# Patient Record
Sex: Female | Born: 1960
Health system: Southern US, Community
[De-identification: ages and names within clinical notes are randomized; demographics above are authoritative.]

## PROBLEM LIST (undated history)

## (undated) DIAGNOSIS — K219 Gastro-esophageal reflux disease without esophagitis: Secondary | ICD-10-CM

## (undated) DIAGNOSIS — Z807 Family history of other malignant neoplasms of lymphoid, hematopoietic and related tissues: Secondary | ICD-10-CM

## (undated) DIAGNOSIS — G500 Trigeminal neuralgia: Secondary | ICD-10-CM

## (undated) DIAGNOSIS — F329 Major depressive disorder, single episode, unspecified: Secondary | ICD-10-CM

## (undated) DIAGNOSIS — E7800 Pure hypercholesterolemia, unspecified: Secondary | ICD-10-CM

## (undated) DIAGNOSIS — J069 Acute upper respiratory infection, unspecified: Secondary | ICD-10-CM

## (undated) DIAGNOSIS — F32A Depression, unspecified: Secondary | ICD-10-CM

## (undated) DIAGNOSIS — Z803 Family history of malignant neoplasm of breast: Secondary | ICD-10-CM

## (undated) DIAGNOSIS — Z808 Family history of malignant neoplasm of other organs or systems: Secondary | ICD-10-CM

## (undated) DIAGNOSIS — R079 Chest pain, unspecified: Secondary | ICD-10-CM

## (undated) DIAGNOSIS — F319 Bipolar disorder, unspecified: Secondary | ICD-10-CM

## (undated) DIAGNOSIS — Z800 Family history of malignant neoplasm of digestive organs: Secondary | ICD-10-CM

## (undated) DIAGNOSIS — J309 Allergic rhinitis, unspecified: Secondary | ICD-10-CM

## (undated) DIAGNOSIS — D473 Essential (hemorrhagic) thrombocythemia: Secondary | ICD-10-CM

## (undated) DIAGNOSIS — F419 Anxiety disorder, unspecified: Secondary | ICD-10-CM

## (undated) DIAGNOSIS — F909 Attention-deficit hyperactivity disorder, unspecified type: Secondary | ICD-10-CM

## (undated) HISTORY — DX: Acute upper respiratory infection, unspecified: J06.9

## (undated) HISTORY — DX: Depression, unspecified: F32.A

## (undated) HISTORY — DX: Family history of malignant neoplasm of other organs or systems: Z80.8

## (undated) HISTORY — PX: BREAST BIOPSY: SHX20

## (undated) HISTORY — DX: Pure hypercholesterolemia, unspecified: E78.00

## (undated) HISTORY — DX: Family history of malignant neoplasm of digestive organs: Z80.0

## (undated) HISTORY — PX: CHOLECYSTECTOMY: SHX55

## (undated) HISTORY — DX: Chest pain, unspecified: R07.9

## (undated) HISTORY — DX: Trigeminal neuralgia: G50.0

## (undated) HISTORY — DX: Allergic rhinitis, unspecified: J30.9

## (undated) HISTORY — DX: Essential (hemorrhagic) thrombocythemia: D47.3

## (undated) HISTORY — DX: Major depressive disorder, single episode, unspecified: F32.9

## (undated) HISTORY — DX: Attention-deficit hyperactivity disorder, unspecified type: F90.9

## (undated) HISTORY — DX: Family history of other malignant neoplasms of lymphoid, hematopoietic and related tissues: Z80.7

## (undated) HISTORY — DX: Family history of malignant neoplasm of breast: Z80.3

## (undated) HISTORY — DX: Bipolar disorder, unspecified: F31.9

## (undated) HISTORY — DX: Gastro-esophageal reflux disease without esophagitis: K21.9

## (undated) HISTORY — PX: CYST REMOVAL HAND: SHX6279

## (undated) HISTORY — DX: Anxiety disorder, unspecified: F41.9

---

## 1998-08-10 ENCOUNTER — Emergency Department (HOSPITAL_COMMUNITY): Admission: EM | Admit: 1998-08-10 | Discharge: 1998-08-10 | Payer: Self-pay | Admitting: Internal Medicine

## 1999-12-04 ENCOUNTER — Encounter: Payer: Self-pay | Admitting: Internal Medicine

## 1999-12-04 ENCOUNTER — Ambulatory Visit (HOSPITAL_COMMUNITY): Admission: RE | Admit: 1999-12-04 | Discharge: 1999-12-04 | Payer: Self-pay | Admitting: Internal Medicine

## 2000-09-11 ENCOUNTER — Encounter: Payer: Self-pay | Admitting: Internal Medicine

## 2000-09-11 ENCOUNTER — Encounter: Admission: RE | Admit: 2000-09-11 | Discharge: 2000-09-11 | Payer: Self-pay | Admitting: Internal Medicine

## 2000-10-07 ENCOUNTER — Ambulatory Visit (HOSPITAL_BASED_OUTPATIENT_CLINIC_OR_DEPARTMENT_OTHER): Admission: RE | Admit: 2000-10-07 | Discharge: 2000-10-07 | Payer: Self-pay | Admitting: General Surgery

## 2000-10-07 ENCOUNTER — Encounter (INDEPENDENT_AMBULATORY_CARE_PROVIDER_SITE_OTHER): Payer: Self-pay | Admitting: Specialist

## 2002-08-12 ENCOUNTER — Ambulatory Visit (HOSPITAL_COMMUNITY): Admission: RE | Admit: 2002-08-12 | Discharge: 2002-08-12 | Payer: Self-pay | Admitting: Internal Medicine

## 2002-08-12 ENCOUNTER — Encounter: Payer: Self-pay | Admitting: Internal Medicine

## 2003-10-23 ENCOUNTER — Other Ambulatory Visit: Admission: RE | Admit: 2003-10-23 | Discharge: 2003-10-23 | Payer: Self-pay | Admitting: Internal Medicine

## 2003-11-23 ENCOUNTER — Ambulatory Visit (HOSPITAL_COMMUNITY): Admission: RE | Admit: 2003-11-23 | Discharge: 2003-11-23 | Payer: Self-pay | Admitting: Neurology

## 2004-11-01 ENCOUNTER — Ambulatory Visit (HOSPITAL_COMMUNITY): Admission: RE | Admit: 2004-11-01 | Discharge: 2004-11-01 | Payer: Self-pay | Admitting: Internal Medicine

## 2005-10-27 ENCOUNTER — Other Ambulatory Visit: Admission: RE | Admit: 2005-10-27 | Discharge: 2005-10-27 | Payer: Self-pay | Admitting: Internal Medicine

## 2007-08-25 ENCOUNTER — Other Ambulatory Visit: Admission: RE | Admit: 2007-08-25 | Discharge: 2007-08-25 | Payer: Self-pay | Admitting: Internal Medicine

## 2007-09-09 ENCOUNTER — Ambulatory Visit (HOSPITAL_COMMUNITY): Admission: RE | Admit: 2007-09-09 | Discharge: 2007-09-09 | Payer: Self-pay | Admitting: Internal Medicine

## 2008-08-25 ENCOUNTER — Ambulatory Visit: Payer: Self-pay | Admitting: Sports Medicine

## 2008-08-25 DIAGNOSIS — M25569 Pain in unspecified knee: Secondary | ICD-10-CM | POA: Insufficient documentation

## 2008-08-25 DIAGNOSIS — M7650 Patellar tendinitis, unspecified knee: Secondary | ICD-10-CM | POA: Insufficient documentation

## 2008-08-29 ENCOUNTER — Encounter: Payer: Self-pay | Admitting: Sports Medicine

## 2009-09-13 ENCOUNTER — Other Ambulatory Visit: Admission: RE | Admit: 2009-09-13 | Discharge: 2009-09-13 | Payer: Self-pay | Admitting: General Surgery

## 2009-09-17 ENCOUNTER — Ambulatory Visit (HOSPITAL_COMMUNITY): Admission: RE | Admit: 2009-09-17 | Discharge: 2009-09-17 | Payer: Self-pay | Admitting: Internal Medicine

## 2010-10-17 ENCOUNTER — Ambulatory Visit (HOSPITAL_COMMUNITY)
Admission: RE | Admit: 2010-10-17 | Discharge: 2010-10-17 | Payer: Self-pay | Source: Home / Self Care | Attending: Internal Medicine | Admitting: Internal Medicine

## 2011-10-20 ENCOUNTER — Other Ambulatory Visit (HOSPITAL_COMMUNITY): Payer: Self-pay | Admitting: Internal Medicine

## 2011-10-20 DIAGNOSIS — Z1231 Encounter for screening mammogram for malignant neoplasm of breast: Secondary | ICD-10-CM

## 2011-11-10 ENCOUNTER — Ambulatory Visit (INDEPENDENT_AMBULATORY_CARE_PROVIDER_SITE_OTHER): Payer: 59 | Admitting: Family Medicine

## 2011-11-10 VITALS — BP 124/84 | HR 80 | Temp 98.1°F | Resp 16 | Ht 63.25 in | Wt 174.0 lb

## 2011-11-10 DIAGNOSIS — J019 Acute sinusitis, unspecified: Secondary | ICD-10-CM

## 2011-11-10 MED ORDER — LEVOFLOXACIN 500 MG PO TABS: 500.0000 mg | ORAL_TABLET | Freq: Every day | ORAL | Status: AC

## 2011-11-10 NOTE — Progress Notes (Signed)
Is a 51 year old woman who is unemployed. She recently went on a trip to [place] with her friends. She comes in today complaining of severe sinus congestion which began about 3 days ago. She developed right maxillary tenderness. Been unable to breathe through her mouth she flew home yesterday and had excruciating pain as of late this is a plain descended.  Patient has recently had dental work prior to the sinus infection and was placed on clindamycin during that time. She had some loose stools but this has resolved.   objective: Small hemorrhagic hemotympanums on the left.  Nose significantly swollen with mucopurulent drainage. Right TM serous changes. Oropharynx clear. Chest clear. Neck supple without adenopathy.  Assessment: Acute sinusitis with hemotympanum on the left.  Plan: Levaquin and Afrin. S. patient is to take culturelle for another probiotic while taking Levaquin.

## 2011-11-10 NOTE — Patient Instructions (Signed)
Use Afrin type spray twice daily for three days. Take Culturelle or other probiotic daily while on antibiotics    Sinusitis Sinuses are air pockets within the bones of your face. The growth of bacteria within a sinus leads to infection. The infection prevents the sinuses from draining. This infection is called sinusitis. SYMPTOMS  There will be different areas of pain depending on which sinuses have become infected.  The maxillary sinuses often produce pain beneath the eyes.   Frontal sinusitis may cause pain in the middle of the forehead and above the eyes.  Other problems (symptoms) include:  Toothaches.   Colored, pus-like (purulent) drainage from the nose.   Swelling, warmth, and tenderness over the sinus areas may be signs of infection.  TREATMENT  Sinusitis is most often determined by an exam.X-rays may be taken. If x-rays have been taken, make sure you obtain your results or find out how you are to obtain them. Your caregiver may give you medications (antibiotics). These are medications that will help kill the bacteria causing the infection. You may also be given a medication (decongestant) that helps to reduce sinus swelling.  HOME CARE INSTRUCTIONS   Only take over-the-counter or prescription medicines for pain, discomfort, or fever as directed by your caregiver.   Drink extra fluids. Fluids help thin the mucus so your sinuses can drain more easily.   Applying either moist heat or ice packs to the sinus areas may help relieve discomfort.   Use saline nasal sprays to help moisten your sinuses. The sprays can be found at your local drugstore.  SEEK IMMEDIATE MEDICAL CARE IF:  You have a fever.   You have increasing pain, severe headaches, or toothache.   You have nausea, vomiting, or drowsiness.   You develop unusual swelling around the face or trouble seeing.  MAKE SURE YOU:   Understand these instructions.   Will watch your condition.   Will get help right away  if you are not doing well or get worse.  Document Released: 09/22/2005 Document Revised: 06/04/2011 Document Reviewed: 04/21/2007 [facility] Patient Information 2012 [place], [place].

## 2011-11-17 ENCOUNTER — Ambulatory Visit (HOSPITAL_COMMUNITY)
Admission: RE | Admit: 2011-11-17 | Discharge: 2011-11-17 | Disposition: A | Discharge status: Home / Self Care | Payer: 59 | Source: Ambulatory Visit | Attending: Internal Medicine | Admitting: Internal Medicine

## 2011-11-17 DIAGNOSIS — Z1231 Encounter for screening mammogram for malignant neoplasm of breast: Secondary | ICD-10-CM | POA: Insufficient documentation

## 2011-12-26 ENCOUNTER — Ambulatory Visit
Admission: RE | Admit: 2011-12-26 | Discharge: 2011-12-26 | Disposition: A | Discharge status: Home / Self Care | Payer: 59 | Source: Ambulatory Visit | Attending: Family Medicine | Admitting: Family Medicine

## 2011-12-26 ENCOUNTER — Other Ambulatory Visit: Payer: Self-pay | Admitting: Family Medicine

## 2011-12-26 DIAGNOSIS — J329 Chronic sinusitis, unspecified: Secondary | ICD-10-CM

## 2012-01-26 ENCOUNTER — Ambulatory Visit (INDEPENDENT_AMBULATORY_CARE_PROVIDER_SITE_OTHER): Payer: 59 | Admitting: Family Medicine

## 2012-01-26 ENCOUNTER — Encounter: Payer: Self-pay | Admitting: Family Medicine

## 2012-01-26 VITALS — BP 108/74 | HR 88 | Temp 99.5°F | Resp 16 | Ht 64.0 in | Wt 185.2 lb

## 2012-01-26 DIAGNOSIS — J0100 Acute maxillary sinusitis, unspecified: Secondary | ICD-10-CM

## 2012-01-26 MED ORDER — MOMETASONE FUROATE 50 MCG/ACT NA SUSP: 2.0000 | Freq: Every day | NASAL | Status: DC

## 2012-01-26 MED ORDER — LEVOFLOXACIN 500 MG PO TABS: 500.0000 mg | ORAL_TABLET | Freq: Every day | ORAL | Status: AC

## 2012-01-26 NOTE — Progress Notes (Signed)
51 yo with severe sinus infection treated with cefdinir and prednisone until last week.  PCP did blood work showing elevated platelets, abnormal LFT's.  Now the left malar area is feeling full again with left ear pressure.  O:  NAD Skin:  Plethoric TM's appears normal Nose:  Very erythematous and swollen Oroph: mild erythema without exudates.  A:  Sinusitis, persistent  P:  Levaquin 500 mg qd x 7 Nasonex bid x 3 days, then qd

## 2012-01-26 NOTE — Patient Instructions (Addendum)
Sinusitis Sinuses are air pockets within the bones of your face. The growth of bacteria within a sinus leads to infection. The infection prevents the sinuses from draining. This infection is called sinusitis. SYMPTOMS  There will be different areas of pain depending on which sinuses have become infected.  The maxillary sinuses often produce pain beneath the eyes.   Frontal sinusitis may cause pain in the middle of the forehead and above the eyes.  Other problems (symptoms) include:  Toothaches.   Colored, pus-like (purulent) drainage from the nose.   Swelling, warmth, and tenderness over the sinus areas may be signs of infection.  TREATMENT  Sinusitis is most often determined by an exam.X-rays may be taken. If x-rays have been taken, make sure you obtain your results or find out how you are to obtain them. Your caregiver may give you medications (antibiotics). These are medications that will help kill the bacteria causing the infection. You may also be given a medication (decongestant) that helps to reduce sinus swelling.  HOME CARE INSTRUCTIONS   Only take over-the-counter or prescription medicines for pain, discomfort, or fever as directed by your caregiver.   Drink extra fluids. Fluids help thin the mucus so your sinuses can drain more easily.   Applying either moist heat or ice packs to the sinus areas may help relieve discomfort.   Use saline nasal sprays to help moisten your sinuses. The sprays can be found at your local drugstore.  SEEK IMMEDIATE MEDICAL CARE IF:  You have a fever.   You have increasing pain, severe headaches, or toothache.   You have nausea, vomiting, or drowsiness.   You develop unusual swelling around the face or trouble seeing.  MAKE SURE YOU:   Understand these instructions.   Will watch your condition.   Will get help right away if you are not doing well or get worse.  Document Released: 09/22/2005 Document Revised: 09/11/2011 Document Reviewed:  04/21/2007 [employer] Patient Information 2012 [employer].Sinusitis Sinuses are air pockets within the bones of your face. The growth of bacteria within a sinus leads to infection. The infection prevents the sinuses from draining. This infection is called sinusitis. SYMPTOMS  There will be different areas of pain depending on which sinuses have become infected.  The maxillary sinuses often produce pain beneath the eyes.   Frontal sinusitis may cause pain in the middle of the forehead and above the eyes.  Other problems (symptoms) include:  Toothaches.   Colored, pus-like (purulent) drainage from the nose.   Swelling, warmth, and tenderness over the sinus areas may be signs of infection.  TREATMENT  Sinusitis is most often determined by an exam.X-rays may be taken. If x-rays have been taken, make sure you obtain your results or find out how you are to obtain them. Your caregiver may give you medications (antibiotics). These are medications that will help kill the bacteria causing the infection. You may also be given a medication (decongestant) that helps to reduce sinus swelling.  HOME CARE INSTRUCTIONS   Only take over-the-counter or prescription medicines for pain, discomfort, or fever as directed by your caregiver.   Drink extra fluids. Fluids help thin the mucus so your sinuses can drain more easily.   Applying either moist heat or ice packs to the sinus areas may help relieve discomfort.   Use saline nasal sprays to help moisten your sinuses. The sprays can be found at your local drugstore.  SEEK IMMEDIATE MEDICAL CARE IF:  You have a fever.     You have increasing pain, severe headaches, or toothache.   You have nausea, vomiting, or drowsiness.   You develop unusual swelling around the face or trouble seeing.  MAKE SURE YOU:   Understand these instructions.   Will watch your condition.   Will get help right away if you are not doing well or get worse.  Document  Released: 09/22/2005 Document Revised: 09/11/2011 Document Reviewed: 04/21/2007 [employer] Patient Information 2012 [employer]. 

## 2012-03-10 ENCOUNTER — Ambulatory Visit (INDEPENDENT_AMBULATORY_CARE_PROVIDER_SITE_OTHER): Payer: 59 | Admitting: Physician Assistant

## 2012-03-10 VITALS — BP 133/84 | HR 66 | Temp 98.4°F | Resp 18 | Ht 64.0 in | Wt 186.4 lb

## 2012-03-10 DIAGNOSIS — J329 Chronic sinusitis, unspecified: Secondary | ICD-10-CM

## 2012-03-10 DIAGNOSIS — R05 Cough: Secondary | ICD-10-CM

## 2012-03-10 DIAGNOSIS — R059 Cough, unspecified: Secondary | ICD-10-CM

## 2012-03-10 DIAGNOSIS — H109 Unspecified conjunctivitis: Secondary | ICD-10-CM

## 2012-03-10 MED ORDER — POLYMYXIN B-TRIMETHOPRIM 10000-0.1 UNIT/ML-% OP SOLN: 1.0000 [drp] | OPHTHALMIC | Status: AC

## 2012-03-10 MED ORDER — AZITHROMYCIN 250 MG PO TABS: ORAL_TABLET | ORAL | Status: AC

## 2012-03-10 MED ORDER — BENZONATATE 100 MG PO CAPS: 100.0000 mg | ORAL_CAPSULE | Freq: Three times a day (TID) | ORAL | Status: AC | PRN

## 2012-03-10 NOTE — Progress Notes (Signed)
  Subjective:    Patient ID: Anne Watson, female    DOB: [birth year], 51 y.o.   MRN: [id number]  HPI Patient presents with sore throat, slight cough, right eye drainage, and ear pressure. Denies fever, chills, sinus pain, nausea, vomiting or headache. She admits to taking only a couple of OTC Allegra and 1 Mucinex which did not help her symptoms. She is due to start allergy treatment this coming Friday.     Review of Systems  Constitutional: Negative for fever and chills.  HENT: Positive for ear pain (pressure), congestion, sore throat, rhinorrhea and postnasal drip. Negative for neck pain and sinus pressure.   Respiratory: Positive for cough (mild). Negative for wheezing.   Cardiovascular: Negative for chest pain.  Gastrointestinal: Negative for nausea and vomiting.  Neurological: Negative for light-headedness and headaches.       Objective:   Physical Exam  Constitutional: She is oriented to person, place, and time. She appears well-developed and well-nourished.  HENT:  Head: Normocephalic and atraumatic.  Right Ear: Hearing, tympanic membrane, external ear and ear canal normal.  Left Ear: Hearing, external ear and ear canal normal. A middle ear effusion is present.  Mouth/Throat: Uvula is midline. No oropharyngeal exudate.       Mild bilateral erythema  Eyes: EOM and lids are normal. Pupils are equal, round, and reactive to light. Right conjunctiva is injected (withe mild purulent drainage).  Neck: Neck supple.  Cardiovascular: Normal rate, regular rhythm and normal heart sounds.   Pulmonary/Chest: Effort normal and breath sounds normal.  Lymphadenopathy:    She has no cervical adenopathy.  Neurological: She is alert and oriented to person, place, and time.  Psychiatric: She has a normal mood and affect. Her behavior is normal. Judgment and thought content normal.          Assessment & Plan:   1. Cough  Will treat with Tessalon perles at night. Take Allegra in  addition to help with postnasal drip. benzonatate (TESSALON) 100 MG capsule  2. Conjunctivitis  Recommend warm wash cloth to eye twice daily to help with drainage.  trimethoprim-polymyxin b (POLYTRIM) ophthalmic solution  3. Sinusitis  Follow up if no improvement in symptoms after completion of therapy.  azithromycin (ZITHROMAX) 250 MG tablet

## 2012-03-10 NOTE — Patient Instructions (Signed)
Continue Allegra daily to help with allergic symptoms

## 2013-02-01 ENCOUNTER — Other Ambulatory Visit: Payer: Self-pay | Admitting: Podiatry

## 2013-03-29 ENCOUNTER — Other Ambulatory Visit (HOSPITAL_COMMUNITY): Payer: Self-pay | Admitting: Internal Medicine

## 2013-03-29 DIAGNOSIS — Z1231 Encounter for screening mammogram for malignant neoplasm of breast: Secondary | ICD-10-CM

## 2013-04-21 ENCOUNTER — Ambulatory Visit (HOSPITAL_COMMUNITY)
Admission: RE | Admit: 2013-04-21 | Discharge: 2013-04-21 | Disposition: A | Discharge status: Home / Self Care | Payer: No Typology Code available for payment source | Source: Ambulatory Visit | Attending: Internal Medicine | Admitting: Internal Medicine

## 2013-04-21 DIAGNOSIS — Z1231 Encounter for screening mammogram for malignant neoplasm of breast: Secondary | ICD-10-CM | POA: Insufficient documentation

## 2014-03-12 ENCOUNTER — Emergency Department (HOSPITAL_COMMUNITY)
Admission: EM | Admit: 2014-03-12 | Discharge: 2014-03-13 | Disposition: A | Discharge status: Home / Self Care | Payer: BC Managed Care – PPO | Attending: Emergency Medicine | Admitting: Emergency Medicine

## 2014-03-12 ENCOUNTER — Encounter (HOSPITAL_COMMUNITY): Payer: Self-pay | Admitting: Emergency Medicine

## 2014-03-12 DIAGNOSIS — Y929 Unspecified place or not applicable: Secondary | ICD-10-CM | POA: Insufficient documentation

## 2014-03-12 DIAGNOSIS — Z87891 Personal history of nicotine dependence: Secondary | ICD-10-CM | POA: Insufficient documentation

## 2014-03-12 DIAGNOSIS — S8010XA Contusion of unspecified lower leg, initial encounter: Secondary | ICD-10-CM | POA: Insufficient documentation

## 2014-03-12 DIAGNOSIS — F329 Major depressive disorder, single episode, unspecified: Secondary | ICD-10-CM | POA: Insufficient documentation

## 2014-03-12 DIAGNOSIS — G500 Trigeminal neuralgia: Secondary | ICD-10-CM | POA: Insufficient documentation

## 2014-03-12 DIAGNOSIS — Z888 Allergy status to other drugs, medicaments and biological substances status: Secondary | ICD-10-CM | POA: Insufficient documentation

## 2014-03-12 DIAGNOSIS — F3289 Other specified depressive episodes: Secondary | ICD-10-CM | POA: Insufficient documentation

## 2014-03-12 DIAGNOSIS — K219 Gastro-esophageal reflux disease without esophagitis: Secondary | ICD-10-CM | POA: Insufficient documentation

## 2014-03-12 DIAGNOSIS — S8011XA Contusion of right lower leg, initial encounter: Secondary | ICD-10-CM

## 2014-03-12 DIAGNOSIS — Z853 Personal history of malignant neoplasm of breast: Secondary | ICD-10-CM | POA: Insufficient documentation

## 2014-03-12 DIAGNOSIS — D473 Essential (hemorrhagic) thrombocythemia: Secondary | ICD-10-CM | POA: Insufficient documentation

## 2014-03-12 DIAGNOSIS — Z880 Allergy status to penicillin: Secondary | ICD-10-CM | POA: Insufficient documentation

## 2014-03-12 DIAGNOSIS — Y939 Activity, unspecified: Secondary | ICD-10-CM | POA: Insufficient documentation

## 2014-03-12 DIAGNOSIS — W19XXXA Unspecified fall, initial encounter: Secondary | ICD-10-CM | POA: Insufficient documentation

## 2014-03-12 DIAGNOSIS — E7800 Pure hypercholesterolemia, unspecified: Secondary | ICD-10-CM | POA: Insufficient documentation

## 2014-03-12 NOTE — ED Notes (Signed)
Per pt report: pt has swelling on the right leg. Pt noticed it last night when pt was in the shower.  Pt has a hematoma on near the knee cap.  Pt hx of high platelet count. Pt not in any pain or having SOB. Legs equally warm and pt has a cap refill <3 seconds. Pt a/o x 4. Skin warm and dry. Pt ambulatory.

## 2014-03-13 NOTE — ED Provider Notes (Signed)
CSN: [id number]     Arrival date & time 03/12/14  2233 History   First MD Initiated Contact with Patient 03/13/14 0017     Chief Complaint  Patient presents with  . Leg Swelling     (Consider location/radiation/quality/duration/timing/severity/associated sxs/prior Treatment) HPI Patient is a 53 yo woman who notes that she was diagnosed in Feb 2015 with thrombocytosis (platelet count 620,000) incidentally.   She comes in because she has noticed a small hematoma over the lateral aspect of the proximal right lower leg. First noticed swelling yesterday, then discoloration of skin today. She has mild ttp. Can't recall hx of trauma to this region.  A family member told her that she should get checked out for a blood clot.   No other incidental bruises or hematomas, no bleeding from gums.   Past Medical History  Diagnosis Date  . GERD (gastroesophageal reflux disease)   . Hypercholesteremia   . Depression   . Trigeminal neuralgia   . Chest pain   . Breast cancer   . Allergic rhinitis   . Thrombocytosis    Past Surgical History  Procedure Laterality Date  . Cholecystectomy    . Breast biopsy Left    Family History  Problem Relation Age of Onset  . Breast cancer Mother   . Stroke Father   . Hypertension Father   . Breast cancer Sister    History  Substance Use Topics  . Smoking status: Former Smoker    Quit date: 10/06/2004  . Smokeless tobacco: Not on file  . Alcohol Use: Yes     Comment: occasional   OB History   Grav Para Term Preterm Abortions TAB SAB Ect Mult Living                 Review of Systems Ten point review of symptoms performed and is negative with the exception of symptoms noted above.     Allergies  Ceftin; Codeine; Penicillins; and Tetracyclines & related  Home Medications   Prior to Admission medications   Medication Sig Start Date End Date Taking? Authorizing Provider  buPROPion (ZYBAN) 150 MG 12 hr tablet Take 150 mg by mouth 2 (two) times  daily.    Historical Provider, MD  cholecalciferol (VITAMIN D) 1000 UNITS tablet Take 1,000 Units by mouth daily.    Historical Provider, MD  mometasone (NASONEX) 50 MCG/ACT nasal spray Place 2 sprays into the nose daily. 01/26/12 01/25/13  Robyn Haber, MD   BP 127/74  Pulse 78  Temp(Src) 98.2 F (36.8 C) (Oral)  Resp 20  SpO2 100% Physical Exam Gen: well developed and well nourished appearing Head: NCAT Eyes: PERL, EOMI Nose: no epistaixis or rhinorrhea Mouth/throat: mucosa is moist and pink Neck: supple, no stridor Lungs: CTA B, no wheezing, rhonchi or rales CV: RRR, no murmur, extremities appear well perfused.  Abd: soft, notender, nondistended Back: no ttp, no cva ttp Skin: warm and dry Ext: approx 2cm x 2cm non raised region of echymosis noted over prox lateral right lower leg, mild ttp, otherwise normal to inspection, no dependent edema Neuro: CN ii-xii grossly intact, no focal deficits Psyche; normal affect,  calm and cooperative.   ED Course  Procedures (including critical care time)  MDM   Final diagnoses:  Traumatic hematoma of right lower leg       Elyn Peers, MD 03/13/14 0040

## 2014-03-13 NOTE — Discharge Instructions (Signed)
Hematoma A hematoma is a collection of blood under the skin, in an organ, in a body space, in a joint space, or in other tissue. The blood can clot to form a lump that you can see and feel. The lump is often firm and may sometimes become sore and tender. Most hematomas get better in a few days to weeks. However, some hematomas may be serious and require medical care. Hematomas can range in size from very small to very large. CAUSES  A hematoma can be caused by a blunt or penetrating injury. It can also be caused by spontaneous leakage from a blood vessel under the skin. Spontaneous leakage from a blood vessel is more likely to occur in older people, especially those taking blood thinners. Sometimes, a hematoma can develop after certain medical procedures. SIGNS AND SYMPTOMS   A firm lump on the body.  Possible pain and tenderness in the area.  Bruising.Blue, dark blue, purple-red, or yellowish skin may appear at the site of the hematoma if the hematoma is close to the surface of the skin. For hematomas in deeper tissues or body spaces, the signs and symptoms may be subtle. For example, an intra-abdominal hematoma may cause abdominal pain, weakness, fainting, and shortness of breath. An intracranial hematoma may cause a headache or symptoms such as weakness, trouble speaking, or a change in consciousness. DIAGNOSIS  A hematoma can usually be diagnosed based on your medical history and a physical exam. Imaging tests may be needed if your health care provider suspects a hematoma in deeper tissues or body spaces, such as the abdomen, head, or chest. These tests may include ultrasonography or a CT scan.  TREATMENT  Hematomas usually go away on their own over time. Rarely does the blood need to be drained out of the body. Large hematomas or those that may affect vital organs will sometimes need surgical drainage or monitoring. HOME CARE INSTRUCTIONS   Apply ice to the injured area:   Put ice in a  plastic bag.   Place a towel between your skin and the bag.   Leave the ice on for 20 minutes, 2 3 times a day for the first 1 to 2 days.   After the first 2 days, switch to using warm compresses on the hematoma.   Elevate the injured area to help decrease pain and swelling. Wrapping the area with an elastic bandage may also be helpful. Compression helps to reduce swelling and promotes shrinking of the hematoma. Make sure the bandage is not wrapped too tight.   If your hematoma is on a lower extremity and is painful, crutches may be helpful for a couple days.   Only take over-the-counter or prescription medicines as directed by your health care provider. SEEK IMMEDIATE MEDICAL CARE IF:   You have increasing pain, or your pain is not controlled with medicine.   You have a fever.   You have worsening swelling or discoloration.   Your skin over the hematoma breaks or starts bleeding.   Your hematoma is in your chest or abdomen and you have weakness, shortness of breath, or a change in consciousness.  Your hematoma is on your scalp (caused by a fall or injury) and you have a worsening headache or a change in alertness or consciousness. MAKE SURE YOU:   Understand these instructions.  Will watch your condition.  Will get help right away if you are not doing well or get worse. Document Released: 05/06/2004 Document Revised: 05/25/2013 Document Reviewed:   03/02/2013 [employer] Patient Information 2014 [employer].  

## 2014-03-21 ENCOUNTER — Telehealth: Payer: Self-pay | Admitting: Hematology and Oncology

## 2014-03-21 NOTE — Telephone Encounter (Signed)
LEFT MESSAGE AND GAVE NP APPT FOR 06/30 @ 2 W/DR. [place].  [place] DX- ELEVATED PLTS CT WELCOME PACKET MAILED.

## 2014-03-21 NOTE — Telephone Encounter (Signed)
PATIENT CALLED TO CONFIRM APPT FOR 06/30 @ 2 W/DR. [place].

## 2014-04-04 ENCOUNTER — Ambulatory Visit: Payer: BC Managed Care – PPO

## 2014-04-04 ENCOUNTER — Encounter: Payer: Self-pay | Admitting: Hematology and Oncology

## 2014-04-04 ENCOUNTER — Ambulatory Visit (HOSPITAL_BASED_OUTPATIENT_CLINIC_OR_DEPARTMENT_OTHER): Payer: BC Managed Care – PPO | Admitting: Hematology and Oncology

## 2014-04-04 ENCOUNTER — Ambulatory Visit (HOSPITAL_BASED_OUTPATIENT_CLINIC_OR_DEPARTMENT_OTHER): Payer: BC Managed Care – PPO

## 2014-04-04 ENCOUNTER — Telehealth: Payer: Self-pay | Admitting: Hematology and Oncology

## 2014-04-04 VITALS — BP 149/81 | HR 69 | Temp 97.8°F | Resp 18 | Ht 64.0 in | Wt 164.7 lb

## 2014-04-04 DIAGNOSIS — D75839 Thrombocytosis, unspecified: Secondary | ICD-10-CM

## 2014-04-04 DIAGNOSIS — D473 Essential (hemorrhagic) thrombocythemia: Secondary | ICD-10-CM

## 2014-04-04 HISTORY — DX: Thrombocytosis, unspecified: D75.839

## 2014-04-04 LAB — CBC WITH DIFFERENTIAL/PLATELET
BASO%: 1.2 % (ref 0.0–2.0)
Basophils Absolute: 0.1 10*3/uL (ref 0.0–0.1)
EOS ABS: 0.1 10*3/uL (ref 0.0–0.5)
EOS%: 1.7 % (ref 0.0–7.0)
HCT: 35.1 % (ref 34.8–46.6)
HGB: 11.5 g/dL — ABNORMAL LOW (ref 11.6–15.9)
LYMPH%: 41.2 % (ref 14.0–49.7)
MCH: 28.8 pg (ref 25.1–34.0)
MCHC: 32.7 g/dL (ref 31.5–36.0)
MCV: 88.1 fL (ref 79.5–101.0)
MONO#: 0.5 10*3/uL (ref 0.1–0.9)
MONO%: 8.7 % (ref 0.0–14.0)
NEUT%: 47.2 % (ref 38.4–76.8)
NEUTROS ABS: 2.5 10*3/uL (ref 1.5–6.5)
Platelets: 411 10*3/uL — ABNORMAL HIGH (ref 145–400)
RBC: 3.99 10*6/uL (ref 3.70–5.45)
RDW: 12.8 % (ref 11.2–14.5)
WBC: 5.2 10*3/uL (ref 3.9–10.3)
lymph#: 2.1 10*3/uL (ref 0.9–3.3)

## 2014-04-04 LAB — MORPHOLOGY
PLT EST: INCREASED
RBC Comments: NORMAL

## 2014-04-04 LAB — SEDIMENTATION RATE: SED RATE: 18 mm/h (ref 0–22)

## 2014-04-04 LAB — CHCC SMEAR

## 2014-04-04 NOTE — Telephone Encounter (Signed)
gv and printed appt schede and avs for pt for July...sent pt to lab

## 2014-04-04 NOTE — Progress Notes (Signed)
Checked in new pt with no financial concerns. °

## 2014-04-04 NOTE — Assessment & Plan Note (Signed)
The cause is unknown. It could be reactive or related to myeloproliferative disorder. I will order an additional workup for this. With current numbers, she's not at risk of blood clot.

## 2014-04-04 NOTE — Progress Notes (Signed)
[facility] NOTE  Patient Care Team: Horton Finer, MD as PCP - General (Internal Medicine)  CHIEF COMPLAINTS/PURPOSE OF CONSULTATION:  Chronic thrombocytosis  HISTORY OF PRESENTING ILLNESS:  Anne Watson 53 y.o. female is here because of elevated platelet count.  She was found to have abnormal CBC from routine blood work. Her platelet count ranged from 432 is high 600. She was never diagnosed with blood clot. She denies recent infection. The last prescription antibiotics was more than 3 months ago There is not reported symptoms of sinus congestion, cough, urinary frequency/urgency or dysuria, diarrhea, joint swelling/pain or abnormal skin rash. She has intermittent hip pain of unknown etiology. She had no prior history or diagnosis of cancer. Her age appropriate screening programs are up-to-date. The patient has no prior diagnosis of autoimmune disease and was not prescribed corticosteroids related products. * MEDICAL HISTORY:  Past Medical History  Diagnosis Date  . GERD (gastroesophageal reflux disease)   . Hypercholesteremia   . Depression   . Trigeminal neuralgia   . Chest pain   . Allergic rhinitis   . Thrombocytosis   . Thrombocytosis 04/04/2014    SURGICAL HISTORY: Past Surgical History  Procedure Laterality Date  . Cholecystectomy    . Breast biopsy Left   . Cesarean section      SOCIAL HISTORY: History   Social History  . Marital Status: Married    Spouse Name: N/A    Number of Children: N/A  . Years of Education: N/A   Occupational History  . Not on file.   Social History Main Topics  . Smoking status: Former Smoker    Quit date: 10/06/2004  . Smokeless tobacco: Never Used  . Alcohol Use: Yes     Comment: occasional  . Drug Use: No  . Sexual Activity: Not on file   Other Topics Concern  . Not on file   Social History Narrative  . No narrative on file    FAMILY HISTORY: Family History  Problem  Relation Age of Onset  . Breast cancer Mother   . Stroke Father   . Hypertension Father   . Cancer Father     colon ca  . Breast cancer Sister   . Cancer Maternal Grandmother     myeloma    ALLERGIES:  is allergic to ceftin; codeine; penicillins; and tetracyclines & related.  MEDICATIONS:  Current Outpatient Prescriptions  Medication Sig Dispense Refill  . buPROPion (WELLBUTRIN XL) 150 MG 24 hr tablet Take 150 mg by mouth 3 (three) times daily.      . calcium carbonate (TUMS - DOSED IN MG ELEMENTAL CALCIUM) 500 MG chewable tablet Chew 1 tablet by mouth daily.      . cholecalciferol (VITAMIN D) 1000 UNITS tablet Take 1,000 Units by mouth daily.      . fexofenadine (ALLEGRA) 30 MG tablet Take 30 mg by mouth daily. Seasonal allergies      . buPROPion (ZYBAN) 150 MG 12 hr tablet Take 150 mg by mouth 2 (two) times daily.      . mometasone (NASONEX) 50 MCG/ACT nasal spray Place 2 sprays into the nose daily.  17 g  12   No current facility-administered medications for this visit.    REVIEW OF SYSTEMS:   Constitutional: Denies fevers, chills or abnormal night sweats Eyes: Denies blurriness of vision, double vision or watery eyes Ears, nose, mouth, throat, and face: Denies mucositis or sore throat Respiratory: Denies cough, dyspnea or wheezes Cardiovascular: Denies palpitation,  chest discomfort or lower extremity swelling Gastrointestinal:  Denies nausea, heartburn or change in bowel habits Skin: Denies abnormal skin rashes Lymphatics: Denies new lymphadenopathy or easy bruising Neurological:Denies numbness, tingling or new weaknesses Behavioral/Psych: Mood is stable, no new changes  All other systems were reviewed with the patient and are negative.  PHYSICAL EXAMINATION: ECOG PERFORMANCE STATUS: 0 - Asymptomatic  Filed Vitals:   04/04/14 1439  BP: 149/81  Pulse: 69  Temp: 97.8 F (36.6 C)  Resp: 18   Filed Weights   04/04/14 1439  Weight: 164 lb 11.2 oz (74.707 kg)     GENERAL:alert, no distress and comfortable SKIN: skin color, texture, turgor are normal, no rashes or significant lesions EYES: normal, conjunctiva are pink and non-injected, sclera clear OROPHARYNX:no exudate, no erythema and lips, buccal mucosa, and tongue normal  NECK: supple, thyroid normal size, non-tender, without nodularity LYMPH:  no palpable lymphadenopathy in the cervical, axillary or inguinal LUNGS: clear to auscultation and percussion with normal breathing effort HEART: regular rate & rhythm and no murmurs and no lower extremity edema ABDOMEN:abdomen soft, non-tender and normal bowel sounds. No splenomegaly Musculoskeletal:no cyanosis of digits and no clubbing  PSYCH: alert & oriented x 3 with fluent speech NEURO: no focal motor/sensory deficits  LABORATORY DATA:  I have reviewed the data as listed ASSESSMENT & PLAN  Thrombocytosis The cause is unknown. It could be reactive or related to myeloproliferative disorder. I will order an additional workup for this. With current numbers, she's not at risk of blood clot.

## 2014-04-05 LAB — IRON AND TIBC CHCC
%SAT: 25 % (ref 21–57)
Iron: 79 ug/dL (ref 41–142)
TIBC: 315 ug/dL (ref 236–444)
UIBC: 236 ug/dL (ref 120–384)

## 2014-04-05 LAB — FERRITIN CHCC: FERRITIN: 44 ng/mL (ref 9–269)

## 2014-04-13 ENCOUNTER — Telehealth: Payer: Self-pay | Admitting: Hematology and Oncology

## 2014-04-13 NOTE — Telephone Encounter (Signed)
returned pt's call re r/s 7/13 appt to 7/16. lmonvm for pt that NG has no availability 7/16 and asked pt to return my call re keeping 7/13 appt or coming in on another day.

## 2014-04-17 ENCOUNTER — Ambulatory Visit (HOSPITAL_BASED_OUTPATIENT_CLINIC_OR_DEPARTMENT_OTHER): Payer: BC Managed Care – PPO | Admitting: Hematology and Oncology

## 2014-04-17 ENCOUNTER — Encounter: Payer: Self-pay | Admitting: Hematology and Oncology

## 2014-04-17 VITALS — BP 135/76 | HR 64 | Temp 98.1°F | Resp 18 | Ht 64.0 in | Wt 163.8 lb

## 2014-04-17 DIAGNOSIS — D75839 Thrombocytosis, unspecified: Secondary | ICD-10-CM

## 2014-04-17 DIAGNOSIS — D473 Essential (hemorrhagic) thrombocythemia: Secondary | ICD-10-CM

## 2014-04-17 NOTE — Assessment & Plan Note (Signed)
Peripheral blood for JAK2 mutation, BCR/ABL and MPL mutation were all negative. I do not have an explanation for her chronic thrombocytosis. I recommend she stays on aspirin 81 mg daily. I warned her about the risk of thrombosis. I will not perform a bone marrow aspirate and biopsy unless the platelet count is greater than 500, sustained. I recommend she gets CBC done at her primary care physician office on a yearly basis. I will see her back if her platelet count is persistently elevated at greater than 500.

## 2014-04-17 NOTE — Progress Notes (Signed)
[place] NOTE  Horton Finer, MD  SUMMARY OF HEMATOLOGIC HISTORY:  Anne Watson was found to have abnormal CBC from routine blood work. Her platelet count ranged from 432 is high 600. Anne Watson was never diagnosed with blood clot. Anne Watson denies recent infection. The last prescription antibiotics was more than 3 months ago INTERVAL HISTORY: Anne Watson 53 y.o. female returns for a followup. Anne Watson feels well.  I have reviewed the past medical history, past surgical history, social history and family history with the patient and they are unchanged from previous note.  ALLERGIES:  is allergic to ceftin; codeine; penicillins; and tetracyclines & related.  MEDICATIONS:  Current Outpatient Prescriptions  Medication Sig Dispense Refill  . buPROPion (WELLBUTRIN XL) 150 MG 24 hr tablet Take 150 mg by mouth 3 (three) times daily.      . calcium carbonate (TUMS - DOSED IN MG ELEMENTAL CALCIUM) 500 MG chewable tablet Chew 1 tablet by mouth daily.      . cholecalciferol (VITAMIN D) 1000 UNITS tablet Take 1,000 Units by mouth daily.      . fexofenadine (ALLEGRA) 30 MG tablet Take 30 mg by mouth daily. Seasonal allergies       No current facility-administered medications for this visit.     REVIEW OF SYSTEMS:   Constitutional: Denies fevers, chills or night sweats Eyes: Denies blurriness of vision Ears, nose, mouth, throat, and face: Denies mucositis or sore throat Respiratory: Denies cough, dyspnea or wheezes Cardiovascular: Denies palpitation, chest discomfort or lower extremity swelling Gastrointestinal:  Denies nausea, heartburn or change in bowel habits Skin: Denies abnormal skin rashes Lymphatics: Denies new lymphadenopathy or easy bruising Neurological:Denies numbness, tingling or new weaknesses Behavioral/Psych: Mood is stable, no new changes  All other systems were reviewed with the patient and are negative.  PHYSICAL EXAMINATION: ECOG PERFORMANCE  STATUS: 0 - Asymptomatic  Filed Vitals:   04/17/14 1057  BP: 135/76  Pulse: 64  Temp: 98.1 F (36.7 C)  Resp: 18   Filed Weights   04/17/14 1057  Weight: 163 lb 12.8 oz (74.299 kg)    GENERAL:alert, no distress and comfortable SKIN: skin color, texture, turgor are normal, no rashes or significant lesions NEURO: alert & oriented x 3 with fluent speech, no focal motor/sensory deficits  LABORATORY DATA:  I have reviewed the data as listed No results found for this or any previous visit (from the past 48 hour(s)).  Lab Results  Component Value Date   WBC 5.2 04/04/2014   HGB 11.5* 04/04/2014   HCT 35.1 04/04/2014   MCV 88.1 04/04/2014   PLT 411* 04/04/2014    ASSESSMENT & PLAN:  Thrombocytosis Peripheral blood for JAK2 mutation, BCR/ABL and MPL mutation were all negative. I do not have an explanation for her chronic thrombocytosis. I recommend Anne Watson stays on aspirin 81 mg daily. I warned her about the risk of thrombosis. I will not perform a bone marrow aspirate and biopsy unless the platelet count is greater than 500, sustained. I recommend Anne Watson gets CBC done at her primary care physician office on a yearly basis. I will see her back if her platelet count is persistently elevated at greater than 500.      All questions were answered. The patient knows to call the clinic with any problems, questions or concerns. No barriers to learning was detected.  I spent 15 minutes counseling the patient face to face. The total time spent in the appointment was 20 minutes and more than 50% was  on counseling.     [facility], [place], MD 04/17/2014 9:35 PM

## 2014-04-20 ENCOUNTER — Other Ambulatory Visit (HOSPITAL_COMMUNITY): Payer: Self-pay | Admitting: Internal Medicine

## 2014-04-20 DIAGNOSIS — Z1231 Encounter for screening mammogram for malignant neoplasm of breast: Secondary | ICD-10-CM

## 2014-04-25 ENCOUNTER — Ambulatory Visit (HOSPITAL_COMMUNITY): Payer: BC Managed Care – PPO

## 2014-06-11 ENCOUNTER — Ambulatory Visit (INDEPENDENT_AMBULATORY_CARE_PROVIDER_SITE_OTHER): Payer: BC Managed Care – PPO | Admitting: Physician Assistant

## 2014-06-11 VITALS — BP 122/72 | HR 67 | Temp 97.7°F | Resp 16 | Ht 63.0 in | Wt 168.5 lb

## 2014-06-11 DIAGNOSIS — L255 Unspecified contact dermatitis due to plants, except food: Secondary | ICD-10-CM

## 2014-06-11 DIAGNOSIS — M47816 Spondylosis without myelopathy or radiculopathy, lumbar region: Secondary | ICD-10-CM | POA: Insufficient documentation

## 2014-06-11 MED ORDER — PREDNISONE 20 MG PO TABS: ORAL_TABLET | ORAL | Status: DC

## 2014-06-11 NOTE — Patient Instructions (Signed)
Take the Prednisone in the mornings, with food to prevent insomnia and upset stomach. Take the Benadryl at bedtime, as it causes drowsiness. You may use an over-the-counter antihistamine (Claritin, Zyrtec, Allegra) for daytime symptoms, as well as over-the-counter hydrocortisone cream and/or calamine lotion. Stay as cool and dry as you can, as getting hot and sweaty (or taking a very hot shower) can increase the itching.

## 2014-06-11 NOTE — Progress Notes (Signed)
   Subjective:    Patient ID: Anne Watson, female    DOB: 05/06/1961, 53 y.o.   MRN: [id number]   PCP: Horton Finer, MD  Chief Complaint  Patient presents with  . Poison Ivy    arms, forehead & back of neck    Medications, allergies, past medical history, surgical history, family history, social history and problem list reviewed and updated.  Prior to Admission medications   Medication Sig Start Date End Date Taking? Authorizing Provider  buPROPion (WELLBUTRIN XL) 150 MG 24 hr tablet Take 450 mg by mouth daily.    Yes Historical Provider, MD  calcium carbonate (TUMS - DOSED IN MG ELEMENTAL CALCIUM) 500 MG chewable tablet Chew 1 tablet by mouth daily.   Yes Historical Provider, MD  cholecalciferol (VITAMIN D) 1000 UNITS tablet Take 1,000 Units by mouth daily.   Yes Historical Provider, MD  fexofenadine (ALLEGRA) 30 MG tablet Take 30 mg by mouth daily. Seasonal allergies   Yes Historical Provider, MD    HPI  This 53 y.o. female presents for evaluation of what she believes is poison ivy. Trimming hedges at her parents house 4 days ago. Noticed the itchy rash the next day, but thought it was bug bites.  Benadryl and Cortisone 10 cream since yesterday.  It's getting worse, and her pharmacist advised she come in for evaluation. No oral/pulmonary symptoms.  Review of Systems As above.    Objective:   Physical Exam  Constitutional: She is oriented to person, place, and time. She appears well-developed and well-nourished. No distress.  BP 122/72  Pulse 67  Temp(Src) 97.7 F (36.5 C) (Oral)  Resp 16  Ht 5\' 3"  (1.6 m)  Wt 168 lb 8 oz (76.431 kg)  BMI 29.86 kg/m2  SpO2 99%   Eyes: Conjunctivae and EOM are normal. Pupils are equal, round, and reactive to light. No scleral icterus.  Pulmonary/Chest: Effort normal.  Neurological: She is alert and oriented to person, place, and time.  Skin: Skin is warm and dry. Rash noted. Rash is papular, vesicular and urticarial.              Assessment & Plan:  1. Dermatitis due to plants, including poison ivy, sumac, and oak Anticipatory guidance. Supportive care. - predniSONE (DELTASONE) 20 MG tablet; Take 3 PO QAM x3days, 2 PO QAM x3days, 1 PO QAM x3days  Dispense: 18 tablet; Refill: 0   Fara Chute, PA-C Physician Assistant-Certified [facility]

## 2014-06-15 ENCOUNTER — Ambulatory Visit (INDEPENDENT_AMBULATORY_CARE_PROVIDER_SITE_OTHER): Payer: BC Managed Care – PPO | Admitting: Emergency Medicine

## 2014-06-15 VITALS — BP 120/80 | HR 75 | Temp 98.5°F | Resp 16 | Ht 63.5 in | Wt 172.8 lb

## 2014-06-15 DIAGNOSIS — L255 Unspecified contact dermatitis due to plants, except food: Secondary | ICD-10-CM

## 2014-06-15 MED ORDER — HYDROXYZINE HCL 25 MG PO TABS
12.5000 mg | ORAL_TABLET | Freq: Four times a day (QID) | ORAL | Status: DC | PRN
Start: 2014-06-15 — End: 2015-01-18

## 2014-06-15 MED ORDER — DESOXIMETASONE 0.25 % EX CREA: 1.0000 "application " | TOPICAL_CREAM | Freq: Two times a day (BID) | CUTANEOUS | Status: DC

## 2014-06-15 NOTE — Progress Notes (Signed)
[facility] [address] [place] [address] [phone]  Date:  06/15/2014   Name:  Anne Watson   DOB:  April 15, 1961   MRN:  [id number]  PCP:  Horton Finer, MD    Chief Complaint: Follow-up   History of Present Illness:  Anne Watson is a 53 y.o. very pleasant female patient who presents with the following:  Seen over the weekend with poison ivy.  Has a rash on her arms and face and now some on her abdomen as well.   Incomplete relief with prescribed meds. Denies other complaint or health concern today.   Patient Active Problem List   Diagnosis Date Noted  . DJD (degenerative joint disease), lumbar 06/11/2014  . Thrombocytosis 04/04/2014  . KNEE PAIN, LEFT 08/25/2008  . PATELLAR TENDINITIS 08/25/2008    Past Medical History  Diagnosis Date  . GERD (gastroesophageal reflux disease)   . Hypercholesteremia   . Depression   . Trigeminal neuralgia   . Chest pain   . Allergic rhinitis   . Thrombocytosis 04/04/2014    Past Surgical History  Procedure Laterality Date  . Cholecystectomy    . Breast biopsy Left   . Cesarean section      History  Substance Use Topics  . Smoking status: Former Smoker    Quit date: 10/06/2004  . Smokeless tobacco: Never Used  . Alcohol Use: Yes     Comment: occasional    Family History  Problem Relation Age of Onset  . Breast cancer Mother 73    recurrence at age 15  . Stroke Father   . Hypertension Father   . Cancer Father 19    colon cancer  . Breast cancer Sister 69  . Cancer Maternal Grandmother     myeloma    Allergies  Allergen Reactions  . Ceftin [Cefuroxime Axetil] Diarrhea  . Codeine Hives  . Penicillins Hives  . Tetracyclines & Related Hives    Medication list has been reviewed and updated.  Current Outpatient Prescriptions on File Prior to Visit  Medication Sig Dispense Refill  . buPROPion (WELLBUTRIN XL) 150 MG 24 hr tablet Take 450 mg by mouth daily.       . calcium  carbonate (TUMS - DOSED IN MG ELEMENTAL CALCIUM) 500 MG chewable tablet Chew 1 tablet by mouth daily.      . cholecalciferol (VITAMIN D) 1000 UNITS tablet Take 1,000 Units by mouth daily.      . fexofenadine (ALLEGRA) 30 MG tablet Take 30 mg by mouth daily. Seasonal allergies      . predniSONE (DELTASONE) 20 MG tablet Take 3 PO QAM x3days, 2 PO QAM x3days, 1 PO QAM x3days  18 tablet  0   No current facility-administered medications on file prior to visit.    Review of Systems:  As per HPI, otherwise negative.    Physical Examination: Filed Vitals:   06/15/14 1448  BP: 120/80  Pulse: 75  Temp: 98.5 F (36.9 C)  Resp: 16   Filed Vitals:   06/15/14 1448  Height: 5' 3.5" (1.613 m)  Weight: 172 lb 12.8 oz (78.382 kg)   Body mass index is 30.13 kg/(m^2). Ideal Body Weight: Weight in (lb) to have BMI = 25: 143.1   GEN: WDWN, NAD, Non-toxic, Alert & Oriented x 3 HEENT: Atraumatic, Normocephalic.  Ears and Nose: No external deformity. EXTR: No clubbing/cyanosis/edema NEURO: Normal gait.  PSYCH: Normally interactive. Conversant. Not depressed or anxious appearing.  Calm demeanor.  SKIN;  Rash consistent with rhus dermatitis   Assessment and Plan: Vistaril Stop allegra Topicort on non facial areas Calamine lotion  Signed,  Ellison Carwin, MD

## 2014-06-15 NOTE — Patient Instructions (Signed)

## 2014-09-14 ENCOUNTER — Ambulatory Visit (HOSPITAL_COMMUNITY)
Admission: RE | Admit: 2014-09-14 | Discharge: 2014-09-14 | Disposition: A | Discharge status: Home / Self Care | Payer: BC Managed Care – PPO | Source: Ambulatory Visit | Attending: Internal Medicine | Admitting: Internal Medicine

## 2014-09-14 DIAGNOSIS — Z1231 Encounter for screening mammogram for malignant neoplasm of breast: Secondary | ICD-10-CM

## 2014-09-21 ENCOUNTER — Other Ambulatory Visit: Payer: Self-pay | Admitting: Orthopaedic Surgery

## 2014-09-27 ENCOUNTER — Other Ambulatory Visit (HOSPITAL_COMMUNITY)
Admission: RE | Admit: 2014-09-27 | Discharge: 2014-09-27 | Disposition: A | Discharge status: Home / Self Care | Payer: BC Managed Care – PPO | Source: Ambulatory Visit | Attending: Internal Medicine | Admitting: Internal Medicine

## 2014-09-27 ENCOUNTER — Other Ambulatory Visit: Payer: Self-pay | Admitting: Internal Medicine

## 2014-09-27 DIAGNOSIS — Z01419 Encounter for gynecological examination (general) (routine) without abnormal findings: Secondary | ICD-10-CM | POA: Diagnosis present

## 2014-09-27 DIAGNOSIS — Z113 Encounter for screening for infections with a predominantly sexual mode of transmission: Secondary | ICD-10-CM | POA: Insufficient documentation

## 2014-10-02 LAB — CYTOLOGY - PAP

## 2015-01-18 ENCOUNTER — Ambulatory Visit (INDEPENDENT_AMBULATORY_CARE_PROVIDER_SITE_OTHER): Payer: BLUE CROSS/BLUE SHIELD

## 2015-01-18 ENCOUNTER — Ambulatory Visit (INDEPENDENT_AMBULATORY_CARE_PROVIDER_SITE_OTHER): Payer: BLUE CROSS/BLUE SHIELD | Admitting: Family Medicine

## 2015-01-18 VITALS — BP 124/72 | HR 67 | Temp 98.0°F | Resp 16 | Ht 63.5 in | Wt 182.0 lb

## 2015-01-18 DIAGNOSIS — S6721XA Crushing injury of right hand, initial encounter: Secondary | ICD-10-CM

## 2015-01-18 DIAGNOSIS — S60511A Abrasion of right hand, initial encounter: Secondary | ICD-10-CM | POA: Diagnosis not present

## 2015-01-18 DIAGNOSIS — Z23 Encounter for immunization: Secondary | ICD-10-CM

## 2015-01-18 DIAGNOSIS — M79641 Pain in right hand: Secondary | ICD-10-CM

## 2015-01-18 DIAGNOSIS — S60221A Contusion of right hand, initial encounter: Secondary | ICD-10-CM

## 2015-01-18 NOTE — Progress Notes (Addendum)
Patient ID: Anne Watson, female   DOB: 1961/09/29, 54 y.o.   MRN: [id number]   Subjective:  This chart was scribed for Anne Forts, MD by [facility], medical scribe at [facility].The patient was seen in exam room 13 and the patient's care was started at 7:41 PM.   Patient ID: Anne Watson, female    DOB: December 19, 1960, 54 y.o.   MRN: [id number]  01/18/2015  Hand Injury  HPI HPI Comments: Anne Watson is a 54 y.o. female who presents to [facility] complaining of a new right hand injury, acute onset this evening. She has a tingling sensation going up her R arm. Pt was working on her [place] this evening and the hood fell on her right hand. Pt is right handed dominant. No chance of pregnancy. She is unsure of last tetanus. 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  Pt has a superficial abrasion along crush area.  No wrist pain.    Review of Systems  Constitutional: Negative for fever, chills, diaphoresis and fatigue.  HENT: Negative for ear pain, postnasal drip, rhinorrhea, sinus pressure, sore throat and trouble swallowing.   Respiratory: Negative for cough and shortness of breath.   Cardiovascular: Negative for chest pain, palpitations and leg swelling.  Gastrointestinal: Negative for nausea, vomiting, abdominal pain, diarrhea and constipation.  Musculoskeletal: Positive for joint swelling and arthralgias.  Skin: Positive for color change and wound.   Past Medical History  Diagnosis Date  . GERD (gastroesophageal reflux disease)   . Hypercholesteremia   . Depression   . Trigeminal neuralgia   . Chest pain   . Allergic rhinitis   . Thrombocytosis 04/04/2014   Past Surgical History  Procedure Laterality Date  . Cholecystectomy    . Breast biopsy Left   . Cesarean section    . Cyst removal hand Right    Allergies  Allergen Reactions  . Ceftin [Cefuroxime Axetil] Diarrhea  .  Codeine Hives  . Penicillins Hives  . Tetracyclines & Related Hives   Current Outpatient Prescriptions  Medication Sig Dispense Refill  . buPROPion (WELLBUTRIN XL) 150 MG 24 hr tablet Take 450 mg by mouth daily.     . calcium carbonate (TUMS - DOSED IN MG ELEMENTAL CALCIUM) 500 MG chewable tablet Chew 1 tablet by mouth daily.    . cholecalciferol (VITAMIN D) 1000 UNITS tablet Take 1,000 Units by mouth daily.    . fexofenadine (ALLEGRA) 30 MG tablet Take 30 mg by mouth daily. Seasonal allergies     No current facility-administered medications for this visit.      Objective:    BP 124/72 mmHg  Pulse 67  Temp(Src) 98 F (36.7 C) (Oral)  Resp 16  Ht 5' 3.5" (1.613 m)  Wt 182 lb (82.555 kg)  BMI 31.73 kg/m2  SpO2 97% Physical Exam  Constitutional: She is oriented to person, place, and time. She appears well-developed and well-nourished. No distress.  HENT:  Head: Normocephalic and atraumatic.  Eyes: Conjunctivae and EOM are normal. Pupils are equal, round, and reactive to light.  Neck: Normal range of motion. Neck supple. Carotid bruit is not present.  Cardiovascular: Normal rate, regular rhythm, normal heart sounds and intact distal pulses.  Exam reveals no gallop and no friction rub.   No murmur heard. Pulses:      Radial pulses are 2+ on the right side.  Pulmonary/Chest: Effort  normal and breath sounds normal. She has no wheezes. She has no rales.  Musculoskeletal:       Right elbow: Normal.She exhibits normal range of motion and no swelling. No tenderness found. No radial head, no medial epicondyle, no lateral epicondyle and no olecranon process tenderness noted.       Right wrist: Normal. She exhibits normal range of motion, no tenderness, no bony tenderness and no swelling.       Right forearm: Normal. She exhibits no tenderness, no bony tenderness and no swelling.       Right hand: She exhibits tenderness, bony tenderness, laceration and swelling. She exhibits normal range  of motion, normal two-point discrimination, normal capillary refill and no deformity. Normal sensation noted. Normal strength noted.       Hands: +localized area of swelling as outlined with mild ecchymoses present hand.  +Tender along the second metacarpal distribution.    Neurological: She is alert and oriented to person, place, and time. No cranial nerve deficit.  Skin: Skin is warm and dry. No rash noted. She is not diaphoretic. No erythema. No pallor.  Superficial abrasion along volar aspect of R hand.  No active bleeding.  Psychiatric: She has a normal mood and affect. Her behavior is normal.   TDAP ADMINISTERED.    [facility] reading (PRIMARY) by  Dr. Harvest [place] HAND:  NAD   Assessment & Plan:   1. Pain of right hand   2. Contusion, hand, right, initial encounter   3. Crushing injury of right hand, initial encounter   4. Abrasion of right hand, initial encounter     1. R hand contusion/crush injury: New.  No evidence of fracture.  Recommend rest, elevation, icing, NSAIDs PRN.  Limit use of R hand for next week; RTC in one week if no improvement. 2.  Abrasion of R hand:  New.  S/p TDAP.  Local wound care.   No orders of the defined types were placed in this encounter.    No Follow-up on file.   I personally performed the services described in this documentation, which was scribed in my presence. The recorded information has been reviewed and considered.  Kristi Elayne Guerin, M.D. [facility] [address] [place], [place]  [address] [phone] phone [phone] fax

## 2015-01-18 NOTE — Patient Instructions (Signed)
1. Ice hand twice daily for five days.

## 2015-07-09 ENCOUNTER — Ambulatory Visit (INDEPENDENT_AMBULATORY_CARE_PROVIDER_SITE_OTHER): Payer: BLUE CROSS/BLUE SHIELD | Admitting: Emergency Medicine

## 2015-07-09 VITALS — BP 126/76 | HR 66 | Temp 97.8°F | Resp 16 | Ht 63.5 in | Wt 167.0 lb

## 2015-07-09 DIAGNOSIS — L237 Allergic contact dermatitis due to plants, except food: Secondary | ICD-10-CM | POA: Diagnosis not present

## 2015-07-09 MED ORDER — TRIAMCINOLONE ACETONIDE 0.1 % EX CREA: 1.0000 "application " | TOPICAL_CREAM | Freq: Two times a day (BID) | CUTANEOUS | Status: AC

## 2015-07-09 MED ORDER — HYDROXYZINE HCL 25 MG PO TABS
25.0000 mg | ORAL_TABLET | Freq: Three times a day (TID) | ORAL | Status: AC | PRN
Start: 2015-07-09 — End: ?

## 2015-07-09 NOTE — Progress Notes (Signed)
Subjective:  Patient ID: Anne Watson, female    DOB: October 02, 1961  Age: 54 y.o. MRN: [id number]  CC: Rash   HPI Anne Watson presents  she has a rash characteristic of poison ivy on both arms or in her abdomen and right neck following working in the yard. He had little relief of the itching with the her home remedies. Many lesions are excoriated from scratching  History Anne Watson has a past medical history of GERD (gastroesophageal reflux disease); Hypercholesteremia; Depression; Trigeminal neuralgia; Chest pain; Allergic rhinitis; and Thrombocytosis ([place]) (04/04/2014).   She has past surgical history that includes Cholecystectomy; Breast biopsy (Left); Cesarean section; and Cyst removal hand (Right).   Her  family history includes Breast cancer (age of onset: 109) in her sister; Breast cancer (age of onset: 52) in her mother; Cancer in her maternal grandmother; Cancer (age of onset: 62) in her father; Hypertension in her father; Stroke in her father.  She   reports that she quit smoking about 10 years ago. She has never used smokeless tobacco. She reports that she drinks alcohol. She reports that she does not use illicit drugs.  Outpatient Prescriptions Prior to Visit  Medication Sig Dispense Refill  . buPROPion (WELLBUTRIN XL) 150 MG 24 hr tablet Take 450 mg by mouth daily.     . calcium carbonate (TUMS - DOSED IN MG ELEMENTAL CALCIUM) 500 MG chewable tablet Chew 1 tablet by mouth daily.    . cholecalciferol (VITAMIN D) 1000 UNITS tablet Take 1,000 Units by mouth daily.    . fexofenadine (ALLEGRA) 30 MG tablet Take 30 mg by mouth daily. Seasonal allergies     No facility-administered medications prior to visit.    Social History   Social History  . Marital Status: Legally Separated    Spouse Name: n/a  . Number of Children: 2  . Years of Education: 12+   Occupational History  . Accounting   . BARTENDER    Social History Main Topics  . Smoking status: Former Smoker   Quit date: 10/06/2004  . Smokeless tobacco: Never Used  . Alcohol Use: Yes     Comment: occasional  . Drug Use: No  . Sexual Activity: Not Asked   Other Topics Concern  . None   Social History Narrative   Lives with her two daughters.     Review of Systems  Skin: Positive for rash.    Objective:  BP 126/76 mmHg  Pulse 66  Temp(Src) 97.8 F (36.6 C) (Oral)  Resp 16  Ht 5' 3.5" (1.613 m)  Wt 167 lb (75.751 kg)  BMI 29.12 kg/m2  SpO2 96%  Physical Exam  Constitutional: She is oriented to person, place, and time. She appears well-developed and well-nourished.  HENT:  Head: Normocephalic and atraumatic.  Eyes: Conjunctivae are normal. Pupils are equal, round, and reactive to light.  Pulmonary/Chest: Effort normal.  Musculoskeletal: She exhibits no edema.  Neurological: She is alert and oriented to person, place, and time.  Skin: Skin is dry. Rash (Characteristic of poison ivy) noted.  Psychiatric: She has a normal mood and affect. Her behavior is normal. Thought content normal.      Assessment & Plan:   Anne Watson was seen today for rash.  Diagnoses and all orders for this visit:  Poison ivy  Other orders -     triamcinolone cream (KENALOG) 0.1 %; Apply 1 application topically 2 (two) times daily. -     hydrOXYzine (ATARAX/VISTARIL) 25 MG tablet; Take 1 tablet (25  mg total) by mouth 3 (three) times daily as needed for itching.   I am having Anne Watson start on triamcinolone cream and hydrOXYzine. I am also having her maintain her cholecalciferol, buPROPion, calcium carbonate, and fexofenadine.  Meds ordered this encounter  Medications  . triamcinolone cream (KENALOG) 0.1 %    Sig: Apply 1 application topically 2 (two) times daily.    Dispense:  30 g    Refill:  0  . hydrOXYzine (ATARAX/VISTARIL) 25 MG tablet    Sig: Take 1 tablet (25 mg total) by mouth 3 (three) times daily as needed for itching.    Dispense:  30 tablet    Refill:  0    Appropriate red  flag conditions were discussed with the patient as well as actions that should be taken.  Patient expressed his understanding.  Follow-up: Return if symptoms worsen or fail to improve.  Roselee Culver, MD

## 2015-07-09 NOTE — Patient Instructions (Signed)

## 2015-07-13 ENCOUNTER — Ambulatory Visit (INDEPENDENT_AMBULATORY_CARE_PROVIDER_SITE_OTHER): Payer: BLUE CROSS/BLUE SHIELD | Admitting: Family Medicine

## 2015-07-13 VITALS — BP 130/80 | HR 80 | Temp 98.4°F | Resp 16 | Ht 63.0 in | Wt 166.0 lb

## 2015-07-13 DIAGNOSIS — L237 Allergic contact dermatitis due to plants, except food: Secondary | ICD-10-CM

## 2015-07-13 MED ORDER — METHYLPREDNISOLONE SODIUM SUCC 125 MG IJ SOLR
125.0000 mg | Freq: Once | INTRAMUSCULAR | Status: AC
  Administered 2015-07-13: 125 mg via INTRAMUSCULAR

## 2015-07-13 MED ORDER — PREDNISONE 20 MG PO TABS: ORAL_TABLET | ORAL | Status: DC

## 2015-07-13 NOTE — Progress Notes (Signed)
Patient ID: Anne Watson, female    DOB: Jan 06, 1961  Age: 54 y.o. MRN: [id number]  Chief Complaint  Patient presents with  . Rash    poision ivy/ 4 days    Subjective:   Patient is here for recheck of her poison ivy. She was diagnosed a few days ago with this and treated with anti-histamines and topical steroids. The patient is continued to break out with new areas. Has large areas of poison ivy on her right arm above the elbow, left arm below the elbow, abdomen, thighs, and other places. She still seems breaking out with new areas.  Current allergies, medications, problem list, past/family and social histories reviewed.  Objective:  BP 130/80 mmHg  Pulse 80  Temp(Src) 98.4 F (36.9 C) (Oral)  Resp 16  Ht 5\' 3"  (1.6 m)  Wt 166 lb (75.297 kg)  BMI 29.41 kg/m2  SpO2 98%  Extensive areas of contact dermatitis as noted above  Assessment & Plan:   Assessment: 1. Poison ivy dermatitis       Plan: Will treat with an injection of short acting cortisone (Solu-Medrol) as well as a long taper to try and knock this on out since the itching is driving her crazy.   Meds ordered this encounter  Medications  . methylPREDNISolone sodium succinate (SOLU-MEDROL) 125 mg/2 mL injection 125 mg    Sig:   . predniSONE (DELTASONE) 20 MG tablet    Sig: Take 3 daily for 3 days, then 2 daily for 3 days, then 1 daily for 3 days, then one half daily. Take after breakfast.    Dispense:  21 tablet    Refill:  0    Patient Instructions  Wash well  An histamine, either the hydroxyzine or something like Allegra, Zyrtec, Claritin if needed.  Try to avoid scratching. Trim you nails short so you don't cut the skin between scratching.  You have received a shot of Solu-Medrol 125 mg today. This is a cortisone type medicine. Beginning tomorrow morning after breakfast start taking the prednisone as directed.Take 3 daily for 3 days, then 2 daily for 3 days, then 1 daily for 3 days, then one half  daily.  Return if further problems or concerns     Return if symptoms worsen or fail to improve.   Paco Cislo, MD 07/13/2015

## 2015-07-13 NOTE — Patient Instructions (Addendum)
Wash well  An histamine, either the hydroxyzine or something like Allegra, Zyrtec, Claritin if needed.  Try to avoid scratching. Trim you nails short so you don't cut the skin between scratching.  You have received a shot of Solu-Medrol 125 mg today. This is a cortisone type medicine. Beginning tomorrow morning after breakfast start taking the prednisone as directed.Take 3 daily for 3 days, then 2 daily for 3 days, then 1 daily for 3 days, then one half daily.  Return if further problems or concerns

## 2015-09-03 ENCOUNTER — Other Ambulatory Visit: Payer: Self-pay

## 2015-09-03 DIAGNOSIS — Z1231 Encounter for screening mammogram for malignant neoplasm of breast: Secondary | ICD-10-CM

## 2015-09-19 ENCOUNTER — Ambulatory Visit
Admission: RE | Admit: 2015-09-19 | Discharge: 2015-09-19 | Disposition: A | Discharge status: Home / Self Care | Payer: BLUE CROSS/BLUE SHIELD | Source: Ambulatory Visit

## 2015-09-19 DIAGNOSIS — Z1231 Encounter for screening mammogram for malignant neoplasm of breast: Secondary | ICD-10-CM

## 2015-11-12 ENCOUNTER — Ambulatory Visit (INDEPENDENT_AMBULATORY_CARE_PROVIDER_SITE_OTHER): Payer: BLUE CROSS/BLUE SHIELD | Admitting: Internal Medicine

## 2015-11-12 VITALS — BP 118/70 | HR 72 | Temp 97.6°F | Resp 16 | Ht 63.0 in | Wt 164.0 lb

## 2015-11-12 DIAGNOSIS — J3489 Other specified disorders of nose and nasal sinuses: Secondary | ICD-10-CM | POA: Diagnosis not present

## 2015-11-12 MED ORDER — MUPIROCIN CALCIUM 2 % EX CREA: 1.0000 "application " | TOPICAL_CREAM | Freq: Two times a day (BID) | CUTANEOUS | Status: AC

## 2015-11-12 NOTE — Progress Notes (Signed)
Subjective:  By signing my name below, I, Anne Watson, attest that this documentation has been prepared under the direction and in the presence of Leandrew Koyanagi, MD Electronically Signed: Ladene Artist, ED Scribe 11/12/2015 at 6:21 PM.    Patient ID: Anne Watson, female    DOB: [birth year], 55 y.o.   MRN: [id number]  Chief Complaint  Patient presents with  . Nose Problem    says she has  bump inside her nose that has been bothering her for a month  . Hand Problem    right hand where her thumb is has been hurting her   HPI HPI Comments: Anne Watson is a 55 y.o. female who presents to the [facility] complaining of persistent nose pain for the past month. Pt noticed a painful bump inside her nose 1 month ago. She reports associated redness to the area and rare bleeding from the area. No treatments tried PTA. No h/o abscess. No recent antibiotic use.   Right Hand Pain Pt also presents with worsening right thumb pain over the past week. She describes pain as a constant, aching sensation that is exacerbated with movement; improved with applying heat and Advil. 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 Pt is right hand dominant. She reports h/o cyst removal on right thumb 1 year ago.   Past Medical History  Diagnosis Date  . GERD (gastroesophageal reflux disease)   . Hypercholesteremia   . Depression   . Trigeminal neuralgia   . Chest pain   . Allergic rhinitis   . Thrombocytosis ([place]) 04/04/2014   Current Outpatient Prescriptions on File Prior to Visit  Medication Sig Dispense Refill  . buPROPion (WELLBUTRIN XL) 150 MG 24 hr tablet Take 450 mg by mouth daily.     . calcium carbonate (TUMS - DOSED IN MG ELEMENTAL CALCIUM) 500 MG chewable tablet Chew 1 tablet by mouth daily.    . cholecalciferol (VITAMIN D) 1000 UNITS tablet Take 3,000 Units by mouth daily.     . fexofenadine (ALLEGRA) 30 MG tablet Take 30 mg by mouth daily. Reported on 11/12/2015      . hydrOXYzine (ATARAX/VISTARIL) 25 MG tablet Take 1 tablet (25 mg total) by mouth 3 (three) times daily as needed for itching. (Patient not taking: Reported on 11/12/2015) 30 tablet 0  . predniSONE (DELTASONE) 20 MG tablet Take 3 daily for 3 days, then 2 daily for 3 days, then 1 daily for 3 days, then one half daily. Take after breakfast. (Patient not taking: Reported on 11/12/2015) 21 tablet 0  . triamcinolone cream (KENALOG) 0.1 % Apply 1 application topically 2 (two) times daily. (Patient not taking: Reported on 11/12/2015) 30 g 0   No current facility-administered medications on file prior to visit.   Allergies  Allergen Reactions  . Ceftin [Cefuroxime Axetil] Diarrhea  . Codeine Hives  . Penicillins Hives  . Tetracyclines & Related Hives   Review of Systems  HENT:       +Nose sore  Musculoskeletal: Positive for arthralgias.      Objective:   Physical Exam  Constitutional: She is oriented to person, place, and time. She appears well-developed and well-nourished. No distress.  HENT:  Head: Normocephalic and atraumatic.  Tip of the nose is slightly swollen with a very small fluid filled cyst. The inside of the L nostril is tender with slight excoriation but no friability or distinct lesion.   Eyes: Conjunctivae and EOM are normal.  Neck:  Neck supple.  Cardiovascular: Normal rate.   Pulmonary/Chest: Effort normal. No respiratory distress.  Musculoskeletal: Normal range of motion.  Tender in the navicular area at the base of the thumb without swelling. Rest of the thumb is intact.   Neurological: She is alert and oriented to person, place, and time.  Skin: Skin is warm and dry.  Psychiatric: She has a normal mood and affect. Her behavior is normal.  Nursing note and vitals reviewed. BP 118/70 mmHg  Pulse 72  Temp(Src) 97.6 F (36.4 C) (Oral)  Resp 16  Ht 5\' 3"  (1.6 m)  Wt 164 lb (74.39 kg)  BMI 29.06 kg/m2  SpO2 98%     Assessment & Plan:  Pain nares L  Meds ordered this  encounter  Medications  . mupirocin cream (BACTROBAN) 2 %    Sig: Apply 1 application topically 2 (two) times daily.    Dispense:  15 g    Refill:  1     I have completed the patient encounter in its entirety as documented by the scribe, with editing by me where necessary. Daylin Eads P. Laney Pastor, M.D.

## 2016-01-24 ENCOUNTER — Ambulatory Visit (INDEPENDENT_AMBULATORY_CARE_PROVIDER_SITE_OTHER): Payer: Managed Care, Other (non HMO) | Admitting: Family Medicine

## 2016-01-24 VITALS — BP 132/70 | HR 77 | Temp 97.6°F | Resp 16 | Ht 63.0 in | Wt 163.0 lb

## 2016-01-24 DIAGNOSIS — M659 Synovitis and tenosynovitis, unspecified: Secondary | ICD-10-CM

## 2016-01-24 MED ORDER — TRAMADOL HCL 50 MG PO TABS: 50.0000 mg | ORAL_TABLET | Freq: Three times a day (TID) | ORAL | Status: DC | PRN

## 2016-01-24 MED ORDER — PREDNISONE 20 MG PO TABS: ORAL_TABLET | ORAL | Status: AC

## 2016-01-24 NOTE — Progress Notes (Signed)
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 She comes in with about 24 hours of pain in her right thumb area and palm of her hand. She also has some numbness over the distal radius on that side.  Patient denies any trauma. She did have a trigger finger injected on that side last year. Her orthopedist was Dr. Joni Fears  Objective:BP 132/70 mmHg  Pulse 77  Temp(Src) 97.6 F (36.4 C)  Resp 16  Ht 5\' 3"  (1.6 m)  Wt 163 lb (73.936 kg)  BMI 28.88 kg/m2  SpO2 98% Examination of the thumb reveals no obvious deformity. She has full range of motion without much tenderness. When I palpate the base of the right thumb on the radial side she does have some tenderness. She says she's numb over the radial distal condyle.  Assessment: I believe the patient does have a form of tenosynovitis. Hopefully prednisone will relieve some of this since it came on so quickly in the next 12 hours.   Synovitis and tenosynovitis - Plan: traMADol (ULTRAM) 50 MG tablet, predniSONE (DELTASONE) 20 MG tablet  Robyn Haber M.D.

## 2016-01-24 NOTE — Patient Instructions (Addendum)
I believe you have to DeQuervain synovitis which is in irritation of the superficial radial nerve as it rubs against the tendon of the thumb. If you're not better in 12 hours, please call me so I can make a orthopedic referral

## 2016-01-25 ENCOUNTER — Telehealth: Payer: Self-pay

## 2016-01-25 DIAGNOSIS — M65831 Other synovitis and tenosynovitis, right forearm: Secondary | ICD-10-CM

## 2016-01-25 NOTE — Telephone Encounter (Signed)
Patient was advised to call if she isn't better in 12 hours. Patient is taking medication for pain in her wrist and thumb and it's not getting better. She was seen yesterday. Please advise! [phone]

## 2016-01-25 NOTE — Telephone Encounter (Signed)
Pt would like to know if she could have more traMADol (ULTRAM) 50 MG tablet [id number] pills. She has 5 pills and she is in extreme pain. Please advise at [phone] (H)

## 2016-01-26 ENCOUNTER — Other Ambulatory Visit: Payer: Self-pay | Admitting: Family Medicine

## 2016-01-28 NOTE — Telephone Encounter (Signed)
Called in RF. Notified pt on VM that RF was sent. I don't see any notes from ortho office about referral appt. This was sent in as an urgent referral. Can someone in Referrals please check on this? Thanks!

## 2016-01-29 NOTE — Telephone Encounter (Signed)
She is scheduled for an appointment tomorrow, 01/30/16, at 8:30am at Dr Rudene Anda office on [address].  I left a message for the patient on voicemail with this information.

## 2016-03-10 ENCOUNTER — Ambulatory Visit: Payer: Managed Care, Other (non HMO)

## 2016-08-21 ENCOUNTER — Ambulatory Visit (INDEPENDENT_AMBULATORY_CARE_PROVIDER_SITE_OTHER): Payer: Managed Care, Other (non HMO) | Admitting: Orthopaedic Surgery

## 2016-08-21 ENCOUNTER — Other Ambulatory Visit: Payer: Self-pay | Admitting: Internal Medicine

## 2016-08-21 ENCOUNTER — Encounter (INDEPENDENT_AMBULATORY_CARE_PROVIDER_SITE_OTHER): Payer: Self-pay | Admitting: Orthopaedic Surgery

## 2016-08-21 VITALS — BP 135/88 | HR 70 | Resp 14 | Ht 63.5 in | Wt 160.0 lb

## 2016-08-21 DIAGNOSIS — M19031 Primary osteoarthritis, right wrist: Secondary | ICD-10-CM

## 2016-08-21 DIAGNOSIS — M1811 Unilateral primary osteoarthritis of first carpometacarpal joint, right hand: Secondary | ICD-10-CM | POA: Diagnosis not present

## 2016-08-21 DIAGNOSIS — Z1231 Encounter for screening mammogram for malignant neoplasm of breast: Secondary | ICD-10-CM

## 2016-08-21 MED ORDER — DICLOFENAC SODIUM 1 % TD GEL: 2.0000 g | Freq: Four times a day (QID) | TRANSDERMAL | 1 refills | Status: DC

## 2016-08-21 NOTE — Progress Notes (Signed)
Office Visit Note   Patient: Anne Watson           Date of Birth: Mar 05, 1961           MRN: [id number] Visit Date: 08/21/2016              Requested by: Marius Ditch, MD [phone] [address] [place], [place] [address] PCP: Carlena Sax, MD   Assessment & Plan: Visit Diagnoses:  1. Primary osteoarthritis of first carpometacarpal joint of right hand   2. Primary osteoarthritis of right wrist     Plan: Voltaren Gel F/U prn  Follow-Up Instructions: Return if symptoms worsen or fail to improve.   Orders:  No orders of the defined types were placed in this encounter.  Meds ordered this encounter  Medications  . diclofenac sodium (VOLTAREN) 1 % GEL    Sig: Apply 2 g topically 4 (four) times daily.    Dispense:  3 Tube    Refill:  1    Order Specific Question:   Supervising Provider    Answer:   Garald Balding [[id number]]      Procedures: No procedures performed   Clinical Data: No additional findings.   Subjective: Chief Complaint  Patient presents with  . Right Hand - Pain    Right hand pain, constant when working, sharp, stinging pain, sharp pain down wrist, swelling at times, difficulty sleeping at times, motrin for pain - helps some, heat, pensaid samples - helped some, wants inj.    Review of Systems  Constitutional: Negative.   HENT: Negative.   Respiratory: Negative.   Cardiovascular: Negative.   Gastrointestinal: Negative.   Genitourinary: Negative.   Skin: Negative.   Neurological: Negative.   Hematological: Negative.   Psychiatric/Behavioral: Negative.      Objective: Vital Signs: BP 135/88 (BP Location: Left Arm, Patient Position: Sitting, Cuff Size: Large)   Pulse 70   Resp 14   Ht 5' 3.5" (1.613 m)   Wt 160 lb (72.6 kg)   BMI 27.90 kg/m   Physical Exam  Right Hand Exam   Other  Sensation: normal Pulse: present  Comments:  Diffuse tenderness about radial aspect of wrist with decreased ROM       Specialty  Comments:  No specialty comments available.  Imaging: No results found.   PMFS History: Patient Active Problem List   Diagnosis Date Noted  . DJD (degenerative joint disease), lumbar 06/11/2014  . Thrombocytosis ([place]) 04/04/2014  . KNEE PAIN, LEFT 08/25/2008  . PATELLAR TENDINITIS 08/25/2008   Past Medical History:  Diagnosis Date  . Allergic rhinitis   . Chest pain   . Depression   . GERD (gastroesophageal reflux disease)   . Hypercholesteremia   . Thrombocytosis ([place]) 04/04/2014  . Trigeminal neuralgia     Family History  Problem Relation Age of Onset  . Breast cancer Mother 5    recurrence at age 60  . Stroke Father   . Hypertension Father   . Cancer Father 1    colon cancer  . Colon cancer Father   . Breast cancer Sister 44  . Cancer Maternal Grandmother     myeloma    Past Surgical History:  Procedure Laterality Date  . BREAST BIOPSY Left   . CESAREAN SECTION    . CHOLECYSTECTOMY    . CYST REMOVAL HAND Right    Social History   Occupational History  . 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 [employer]  Social History Main Topics  . Smoking status: Former Smoker    Quit date: 10/06/2004  . Smokeless tobacco: Never Used  . Alcohol use Yes     Comment: occasional  . Drug use: No  . Sexual activity: Not on file

## 2016-08-22 ENCOUNTER — Telehealth (INDEPENDENT_AMBULATORY_CARE_PROVIDER_SITE_OTHER): Payer: Self-pay | Admitting: Orthopaedic Surgery

## 2016-08-22 NOTE — Telephone Encounter (Signed)
Patient states she was seen yesterday and had an injection in her wrist. 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 Just an fyi.  Patient also states she is having a lot of pain in her wrist after the injection. She has been using Voltaren gel on it and has not had any relief. Please advise.

## 2016-08-25 ENCOUNTER — Encounter (INDEPENDENT_AMBULATORY_CARE_PROVIDER_SITE_OTHER): Payer: Self-pay | Admitting: Orthopaedic Surgery

## 2016-08-26 ENCOUNTER — Other Ambulatory Visit (HOSPITAL_COMMUNITY)
Admission: RE | Admit: 2016-08-26 | Discharge: 2016-08-26 | Disposition: A | Discharge status: Home / Self Care | Payer: BLUE CROSS/BLUE SHIELD | Source: Ambulatory Visit | Attending: Internal Medicine | Admitting: Internal Medicine

## 2016-08-26 ENCOUNTER — Telehealth (INDEPENDENT_AMBULATORY_CARE_PROVIDER_SITE_OTHER): Payer: Self-pay | Admitting: Orthopaedic Surgery

## 2016-08-26 ENCOUNTER — Other Ambulatory Visit: Payer: Self-pay | Admitting: Internal Medicine

## 2016-08-26 DIAGNOSIS — Z1151 Encounter for screening for human papillomavirus (HPV): Secondary | ICD-10-CM | POA: Diagnosis present

## 2016-08-26 DIAGNOSIS — Z01419 Encounter for gynecological examination (general) (routine) without abnormal findings: Secondary | ICD-10-CM | POA: Insufficient documentation

## 2016-08-26 NOTE — Telephone Encounter (Signed)
Patient would like a copy of the paper work that was faxed to [employer], she just wants to keep up with everything going on. Patient says she can come pick it up whenever it's ready.

## 2016-08-26 NOTE — Telephone Encounter (Signed)
Faxed info to insurance co

## 2016-09-01 LAB — CYTOLOGY - PAP
DIAGNOSIS: NEGATIVE
HPV: NOT DETECTED

## 2016-09-02 ENCOUNTER — Telehealth (INDEPENDENT_AMBULATORY_CARE_PROVIDER_SITE_OTHER): Payer: Self-pay | Admitting: Orthopaedic Surgery

## 2016-09-02 NOTE — Telephone Encounter (Signed)
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

## 2016-09-02 NOTE — Telephone Encounter (Signed)
Left VM for pt to send to new fax number

## 2016-09-03 ENCOUNTER — Telehealth (INDEPENDENT_AMBULATORY_CARE_PROVIDER_SITE_OTHER): Payer: Self-pay | Admitting: Orthopaedic Surgery

## 2016-09-03 NOTE — Telephone Encounter (Signed)
Patient would like to know if the fax came through from the disability company since the paperwork needs to be completed soon.

## 2016-09-04 NOTE — Telephone Encounter (Signed)
Spoke with pt to resend directly to me as it has not been received in our office with our new number.

## 2016-09-05 ENCOUNTER — Telehealth (INDEPENDENT_AMBULATORY_CARE_PROVIDER_SITE_OTHER): Payer: Self-pay | Admitting: Orthopaedic Surgery

## 2016-09-05 NOTE — Telephone Encounter (Signed)
Called pt, no answer nor an answering machine. I did fax paperwork

## 2016-09-05 NOTE — Telephone Encounter (Signed)
Called pt, sent fax

## 2016-09-05 NOTE — Telephone Encounter (Signed)
Patient would like to know if paperwork had been received that was faxed over and if it was completed and sent back? Patient is requesting that it be sent back today please. Please call patient and let her know.

## 2016-09-11 ENCOUNTER — Encounter (INDEPENDENT_AMBULATORY_CARE_PROVIDER_SITE_OTHER): Payer: Self-pay | Admitting: Orthopaedic Surgery

## 2016-09-11 ENCOUNTER — Ambulatory Visit (INDEPENDENT_AMBULATORY_CARE_PROVIDER_SITE_OTHER): Payer: Managed Care, Other (non HMO) | Admitting: Orthopaedic Surgery

## 2016-09-11 VITALS — BP 140/76 | HR 81 | Resp 12 | Ht 63.5 in | Wt 162.0 lb

## 2016-09-11 DIAGNOSIS — M19041 Primary osteoarthritis, right hand: Secondary | ICD-10-CM | POA: Diagnosis not present

## 2016-09-11 MED ORDER — LIDOCAINE HCL 2 % IJ SOLN
1.0000 mL | INTRAMUSCULAR | Status: AC | PRN
  Administered 2016-09-11: 1 mL

## 2016-09-11 MED ORDER — METHYLPREDNISOLONE ACETATE 40 MG/ML IJ SUSP
40.0000 mg | INTRAMUSCULAR | Status: AC | PRN
  Administered 2016-09-11: 40 mg via INTRA_ARTICULAR

## 2016-09-11 NOTE — Progress Notes (Signed)
Office Visit Note   Patient: Anne Watson           Date of Birth: May 23, 1961           MRN: [id number] Visit Date: 09/11/2016              Requested by: Anne Ditch, MD [phone] [address] [place], [place] [address] PCP: Anne Sax, MD   Assessment & Plan: Visit Diagnoses: Osteoarthritis base of right thumb  Plan: f/u 6 months. 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 Anne Watson prefers to continue with nonoperative treatment  Follow-Up Instructions: No Follow-up on file.   Orders:  No orders of the defined types were placed in this encounter.  No orders of the defined types were placed in this encounter.     Procedures: Small Joint Inj Date/Time: 09/11/2016 2:00 PM Performed by: Anne Watson Authorized by: Anne Watson   Consent Given by:  Patient Location:  Thumb Site:  R thumb [facility] Ultrasound Guided: No   Fluoroscopic Guidance: No   Medications:  1 mL lidocaine 2 %; 40 mg methylPREDNISolone acetate 40 MG/ML Aspiration Attempted: No       Clinical Data: No additional findings.   Subjective: Chief Complaint  Patient presents with  . Right Hand - Pain    Right hand pain x 3 months, stinging, swelling, weakness, Voltaren gel and 3 + Motrin daily for pain - doesn't help,using brace,  took 1 tramadol last night for severe pain - did help. Needs RF of Tramadol. Types at work all day, wants another steroid injection.     Review of Systems   Objective: Vital Signs: BP 140/76 (BP Location: Left Arm, Patient Position: Sitting, Cuff Size: Normal)   Pulse 81   Resp 12   Ht 5' 3.5" (1.613 m)   Wt 162 lb (73.5 kg)   BMI 28.25 kg/m   Physical Exam  Ortho Exam examination of right thumb demonstrates a positive  grind test with tenderness at the base of the thumb (carpal metacarpal joint). Or is no deformity. Neurovascular exam is intact. There is no evidence of de Quervain's.  Specialty Comments:  No specialty comments available.  Imaging: No results found.   PMFS History: Patient Active Problem List   Diagnosis Date Noted  . DJD (degenerative joint disease), lumbar 06/11/2014  . Thrombocytosis ([place]) 04/04/2014  . KNEE PAIN, LEFT 08/25/2008  . PATELLAR TENDINITIS 08/25/2008   Past Medical History:  Diagnosis Date  . Allergic rhinitis   . Chest pain   . Depression   . GERD (gastroesophageal reflux disease)   . Hypercholesteremia   . Thrombocytosis ([place]) 04/04/2014  . Trigeminal neuralgia     Family History  Problem Relation Age of Onset  . Breast cancer Mother 11    recurrence at age 17  . Stroke Father   . Hypertension Father   . Cancer Father 36    colon cancer  . Colon cancer Father   . Breast cancer Sister 45  . Cancer Maternal Grandmother     myeloma    Past Surgical History:  Procedure Laterality Date  . BREAST BIOPSY Left   . CESAREAN SECTION    . CHOLECYSTECTOMY    . CYST REMOVAL HAND Right  Social History   Occupational History  . 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 Smoking status: Former Smoker    Packs/day: 2.00    Years: 30.00    Types: Cigarettes    Quit date: 10/06/2004  . Smokeless tobacco: Never Used  . Alcohol use Yes     Comment: occasional  . Drug use: No  . Sexual activity: Not on file

## 2016-09-15 ENCOUNTER — Ambulatory Visit (INDEPENDENT_AMBULATORY_CARE_PROVIDER_SITE_OTHER): Payer: Managed Care, Other (non HMO) | Admitting: Orthopaedic Surgery

## 2016-10-01 ENCOUNTER — Telehealth (INDEPENDENT_AMBULATORY_CARE_PROVIDER_SITE_OTHER): Payer: Self-pay | Admitting: Orthopaedic Surgery

## 2016-10-01 NOTE — Telephone Encounter (Signed)
Ann from the [employer] (that pays patient's disability pay) is requesting office notes from patient's last office visit. Please fax to [phone] attn: Lelon Frohlich

## 2016-10-01 NOTE — Telephone Encounter (Signed)
Faxed last note to number provided.

## 2016-10-03 ENCOUNTER — Ambulatory Visit: Payer: BLUE CROSS/BLUE SHIELD

## 2016-10-15 ENCOUNTER — Telehealth (INDEPENDENT_AMBULATORY_CARE_PROVIDER_SITE_OTHER): Payer: Self-pay | Admitting: Orthopaedic Surgery

## 2016-10-15 NOTE — Telephone Encounter (Signed)
Patient states the disability company [employer] did not receive the office notes that were faxed to them. Patient is requesting that these please be resent. Fax# [phone]. Patient is also requesting a copy be emailed to her.

## 2016-10-17 NOTE — Telephone Encounter (Signed)
Faxed and mailed on Friday 10/17/16.

## 2016-11-21 ENCOUNTER — Encounter (INDEPENDENT_AMBULATORY_CARE_PROVIDER_SITE_OTHER): Payer: Self-pay | Admitting: Orthopaedic Surgery

## 2016-11-21 ENCOUNTER — Ambulatory Visit (INDEPENDENT_AMBULATORY_CARE_PROVIDER_SITE_OTHER): Payer: Managed Care, Other (non HMO) | Admitting: Orthopaedic Surgery

## 2016-11-21 VITALS — BP 117/75 | HR 71 | Resp 14 | Ht 63.5 in | Wt 160.0 lb

## 2016-11-21 DIAGNOSIS — M180 Bilateral primary osteoarthritis of first carpometacarpal joints: Secondary | ICD-10-CM | POA: Diagnosis not present

## 2016-11-21 NOTE — Progress Notes (Signed)
   Office Visit Note   Patient: Anne Watson           Date of Birth: 07-25-1961           MRN: [id number] Visit Date: 11/21/2016              Requested by: Anne Ditch, MD [phone] [address] [place], [place] [address] PCP: Anne Sax, MD   Assessment & Plan: Visit Diagnoses: Osteoarthritis base of thumbs bilaterally  Plan: Anne Watson is presently working 4 hours a day at [employer] and doing well. Limited work hours seems to be "working"in that she's having much less pain. She does wear the splints as necessary. I would suggest continuing on an unlimited basis with a 4 hour work day. We have discussed other treatment options in the past and she is happywith her present treatment regimen. Follow-Up Instructions: No Follow-up on file.   Orders:  No orders of the defined types were placed in this encounter.  No orders of the defined types were placed in this encounter.     Procedures: No procedures performed   Clinical Data: No additional findings.   Subjective: No chief complaint on file.   Osteoarthritis base of right thumb, wearing thumb splint lost of day. She is doing well with 4hrs a day. She wants to continue with that for May 31,2018.    Review of Systems   Objective: Vital Signs: There were no vitals taken for this visit.  Physical Exam  Ortho Exam Examination of both hands demonstrates positive grind test at the base of both thumbs. There is mild subluxation at the thumb carpal metacarpal joint. Minimal tenderness. No redness. Skin intact. Neurovascular exam intact. Some weakness of grip but not a functional loss Specialty Comments:  No specialty comments available.  Imaging: No results found.   PMFS History: Patient Active Problem List   Diagnosis Date Noted  . DJD (degenerative joint disease), lumbar 06/11/2014  . Thrombocytosis ([place]) 04/04/2014  . KNEE PAIN, LEFT 08/25/2008  . PATELLAR TENDINITIS 08/25/2008   Past Medical History:    Diagnosis Date  . Allergic rhinitis   . Chest pain   . Depression   . GERD (gastroesophageal reflux disease)   . Hypercholesteremia   . Thrombocytosis ([place]) 04/04/2014  . Trigeminal neuralgia     Family History  Problem Relation Age of Onset  . Breast cancer Mother 52    recurrence at age 45  . Stroke Father   . Hypertension Father   . Cancer Father 57    colon cancer  . Colon cancer Father   . Breast cancer Sister 46  . Cancer Maternal Grandmother     myeloma    Past Surgical History:  Procedure Laterality Date  . BREAST BIOPSY Left   . CESAREAN SECTION    . CHOLECYSTECTOMY    . CYST REMOVAL HAND Right    Social History   Occupational History  . 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 Smoking status: Former Smoker    Packs/day: 2.00    Years: 30.00    Types: Cigarettes    Quit date: 10/06/2004  . Smokeless tobacco: Never Used  . Alcohol use Yes     Comment: occasional  . Drug use: No  . Sexual activity: Not on file

## 2016-11-24 ENCOUNTER — Telehealth (INDEPENDENT_AMBULATORY_CARE_PROVIDER_SITE_OTHER): Payer: Self-pay | Admitting: Orthopaedic Surgery

## 2016-11-24 NOTE — Telephone Encounter (Signed)
Patient called wanting to know if her visit notes from Friday are ready.  [phone].  Thank you

## 2016-11-25 NOTE — Telephone Encounter (Signed)
Called patient to  let her know notes at front desk.

## 2016-12-01 ENCOUNTER — Ambulatory Visit
Admission: RE | Admit: 2016-12-01 | Discharge: 2016-12-01 | Disposition: A | Discharge status: Home / Self Care | Payer: Managed Care, Other (non HMO) | Source: Ambulatory Visit | Attending: Internal Medicine | Admitting: Internal Medicine

## 2016-12-01 DIAGNOSIS — Z1231 Encounter for screening mammogram for malignant neoplasm of breast: Secondary | ICD-10-CM

## 2017-01-23 ENCOUNTER — Ambulatory Visit (INDEPENDENT_AMBULATORY_CARE_PROVIDER_SITE_OTHER): Payer: Managed Care, Other (non HMO) | Admitting: Orthopaedic Surgery

## 2017-01-23 ENCOUNTER — Encounter (INDEPENDENT_AMBULATORY_CARE_PROVIDER_SITE_OTHER): Payer: Self-pay | Admitting: Orthopaedic Surgery

## 2017-01-23 VITALS — BP 135/92 | HR 72 | Resp 14 | Ht 65.0 in | Wt 165.0 lb

## 2017-01-23 DIAGNOSIS — M79642 Pain in left hand: Secondary | ICD-10-CM

## 2017-01-23 DIAGNOSIS — M79641 Pain in right hand: Secondary | ICD-10-CM | POA: Diagnosis not present

## 2017-01-23 NOTE — Progress Notes (Signed)
Office Visit Note   Patient: Anne Watson           Date of Birth: 03-Jul-1961           MRN: [id number] Visit Date: 01/23/2017              Requested by: Marius Ditch, MD [phone] [address] [place], [place] [address] PCP: Carlena Sax, MD   Assessment & Plan: Visit Diagnoses:  1. Pain in both hands    Osteoarthritis base both thumbs R>L Plan: Continue with splints as needed, appointment with Dr. Fredna Dow for consideration of  Basilar thumb arthroplasty. Continue with limited work schedule 4 hours per day on an indefinite basis  Follow-Up Instructions: Return if symptoms worsen or fail to improve.   Orders:  No orders of the defined types were placed in this encounter.  No orders of the defined types were placed in this encounter.     Procedures: No procedures performed   Clinical Data: No additional findings.   Subjective: Chief Complaint  Patient presents with  . Right Wrist - Numbness    Anne Watson is a 56 y o with a F/u of right wrist pain and lirtle fdinger hurting. She will continue the 4 hr day schedule.    Anne Watson has been followed on a  Number of occasions for the osteoarthritis of the base of both thumbs. She complains of more difficulty with the right dominant hand than the left. She has been applying a NSAIDs. Presently she is not having much pain but notes that if she works more than 4 hours a day she is uncomfortable. she does work at [employer] 20 hours a week and then has short-term disability for the remaining 10 for total of 30. she like to continue on that schedule. Also has taken tramadol and a very limited basis over time and asked for renewal I. I think it be worthwhile for her to check with her primary care physician as the [facility] physicians have prescribed that the past. Anne Watson is here to update her disability and consider different treatment options HPI  Review of Systems   Objective: Vital Signs: BP (!) 135/92   Pulse 72    Resp 14   Ht 5\' 5"  (1.651 m)   Wt 165 lb (74.8 kg)   BMI 27.46 kg/m   Physical Exam  Ortho Exam grind test the base of the right thumb. Not on the left. Neurovascular exam is intact. Partial subluxation at the carpometacarpal joint right hand with limited grip. Skin intact neurovascular exam intact. The palpation on either side.  Specialty Comments:  No specialty comments available.  Imaging: No results found.   PMFS History: Patient Active Problem List   Diagnosis Date Noted  . DJD (degenerative joint disease), lumbar 06/11/2014  . Thrombocytosis ([place]) 04/04/2014  . KNEE PAIN, LEFT 08/25/2008  . PATELLAR TENDINITIS 08/25/2008   Past Medical History:  Diagnosis Date  . Allergic rhinitis   . Chest pain   . Depression   . GERD (gastroesophageal reflux disease)   . Hypercholesteremia   . Thrombocytosis ([place]) 04/04/2014  . Trigeminal neuralgia     Family History  Problem Relation Age of Onset  . Breast cancer Mother 35    recurrence at age 36  . Stroke Father   . Hypertension Father   . Cancer Father 50    colon cancer  . Colon cancer Father   . Breast cancer Sister 33  .  Cancer Maternal Grandmother     myeloma    Past Surgical History:  Procedure Laterality Date  . BREAST BIOPSY Left   . CESAREAN SECTION    . CHOLECYSTECTOMY    . CYST REMOVAL HAND Right    Social History   Occupational History  . 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 Smoking status: Former Smoker    Packs/day: 2.00    Years: 30.00    Types: Cigarettes    Quit date: 10/06/2004  . Smokeless tobacco: Never Used  . Alcohol use Yes     Comment: occasional  . Drug use: No  . Sexual activity: Not on file

## 2017-03-05 ENCOUNTER — Telehealth (INDEPENDENT_AMBULATORY_CARE_PROVIDER_SITE_OTHER): Payer: Self-pay | Admitting: Orthopaedic Surgery

## 2017-03-05 NOTE — Telephone Encounter (Signed)
I RECEIVED VM FROM Selinda Flavin W/ [employer] DISABILITY CHECKING STATUS OF RECORDS REQUEST. [employer] PROCESSED AS IT WAS A FORM. I FAXED RECORDS [phone]

## 2017-03-17 ENCOUNTER — Telehealth (INDEPENDENT_AMBULATORY_CARE_PROVIDER_SITE_OTHER): Payer: Self-pay

## 2017-03-17 ENCOUNTER — Telehealth: Payer: Self-pay

## 2017-03-17 NOTE — Telephone Encounter (Signed)
LVMOM for clarification on Dr. Fredna Dow

## 2017-03-17 NOTE — Telephone Encounter (Signed)
Patient called stating that she needs a return to work note for the dates of 03/11/17-04/10/17, diagnosis, and the reason why she is only able to work 4 hrs.a day.  [employer] number is [phone].  CB# for patient is [phone]

## 2017-03-17 NOTE — Telephone Encounter (Signed)
Anne Watson is calling about a FMLA note. Would you please call her?

## 2017-03-18 ENCOUNTER — Encounter (INDEPENDENT_AMBULATORY_CARE_PROVIDER_SITE_OTHER): Payer: Self-pay

## 2017-03-19 NOTE — Telephone Encounter (Signed)
Faxed letter

## 2017-04-07 ENCOUNTER — Encounter (INDEPENDENT_AMBULATORY_CARE_PROVIDER_SITE_OTHER): Payer: Self-pay | Admitting: Orthopaedic Surgery

## 2017-04-07 ENCOUNTER — Ambulatory Visit (INDEPENDENT_AMBULATORY_CARE_PROVIDER_SITE_OTHER): Payer: Managed Care, Other (non HMO) | Admitting: Orthopaedic Surgery

## 2017-04-07 VITALS — BP 134/81 | HR 83 | Resp 14 | Ht 68.0 in | Wt 165.0 lb

## 2017-04-07 DIAGNOSIS — M79641 Pain in right hand: Secondary | ICD-10-CM

## 2017-04-07 DIAGNOSIS — M79642 Pain in left hand: Secondary | ICD-10-CM | POA: Diagnosis not present

## 2017-04-07 NOTE — Progress Notes (Signed)
Office Visit Note   Patient: Anne Watson           Date of Birth: 16-Sep-1961           MRN: [id number] Visit Date: 04/07/2017              Requested by: Anne Ditch, MD [phone] [address] [place], [place] [address] PCP: Anne Cha, MD   Assessment & Plan: Visit Diagnoses:  1. Pain in both hands   Osteoarthritis base of both thumbs  Plan: We'll follow at intervals with occasional cortisone injection. She'll need another evaluation for consideration of surgery-her choice  Follow-Up Instructions: Return if symptoms worsen or fail to improve.   Orders:  No orders of the defined types were placed in this encounter.  No orders of the defined types were placed in this encounter.     Procedures: No procedures performed   Clinical Data: No additional findings.   Subjective: Chief Complaint  Patient presents with  . Right Hand - Pain, Edema    Anne Watson is a 56 y o that presents with chronic R hand pain and L hand pain with possible fx of little finger. She relates her hands burn and wants a note with restrictions for LT dx.  . Left Hand - Pain, Edema  Anne Watson release Anne Watson Dr. Daryll Watson for evaluation of arthritis at the base of both of her thumbs. He provided thumb splints that seemed to make a difference. She like to have another opinion at some point in the near future. She'll let me know. She has been on long-term disability since May. She works limited number of hours during the day but is finding it more and more difficult. I will give her a note saying that she is unable to type. I believe her ultimate treatment would be surgery. She like to have another opinion. We can make that referral when she is ready. Apparently her long-term disability "runs out" in February 2019.  HPI  Review of Systems   Objective: Vital Signs: BP 134/81   Pulse 83   Resp 14   Ht 5\' 8"  (1.727 m)   Wt 165 lb (74.8 kg)   BMI 25.09 kg/m   Physical  Exam  Ortho Exam wearing bilateral basilar thumb splints which seemed to make a difference. She has positive grind testing and pain at the base of both thumbs. Today she is more symptomatic on the right than the left. Skin intact. Neurovascular exam intact  Specialty Comments:  No specialty comments available.  Imaging: No results found.   PMFS History: Patient Active Problem List   Diagnosis Date Noted  . DJD (degenerative joint disease), lumbar 06/11/2014  . Thrombocytosis ([place]) 04/04/2014  . KNEE PAIN, LEFT 08/25/2008  . PATELLAR TENDINITIS 08/25/2008   Past Medical History:  Diagnosis Date  . Allergic rhinitis   . Chest pain   . Depression   . GERD (gastroesophageal reflux disease)   . Hypercholesteremia   . Thrombocytosis ([place]) 04/04/2014  . Trigeminal neuralgia     Family History  Problem Relation Age of Onset  . Breast cancer Mother 37       recurrence at age 104  . Stroke Father   . Hypertension Father   . Cancer Father 50       colon cancer  . Colon cancer Father   . Breast cancer Sister 28  . Cancer Maternal Grandmother        myeloma  Past Surgical History:  Procedure Laterality Date  . BREAST BIOPSY Left   . CESAREAN SECTION    . CHOLECYSTECTOMY    . CYST REMOVAL HAND Right    Social History   Occupational History  . 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 Smoking status: Former Smoker    Packs/day: 2.00    Years: 30.00    Types: Cigarettes    Quit date: 10/06/2004  . Smokeless tobacco: Never Used  . Alcohol use Yes     Comment: occasional  . Drug use: No  . Sexual activity: Not on file     Anne Balding, MD   Note - This record has been created using [employer].  Chart creation errors have been sought, but may not always  have been located. Such creation errors do not reflect on  the standard of medical care.

## 2017-04-13 ENCOUNTER — Ambulatory Visit (INDEPENDENT_AMBULATORY_CARE_PROVIDER_SITE_OTHER): Payer: Managed Care, Other (non HMO) | Admitting: Orthopaedic Surgery

## 2017-04-14 ENCOUNTER — Telehealth (INDEPENDENT_AMBULATORY_CARE_PROVIDER_SITE_OTHER): Payer: Self-pay | Admitting: Orthopaedic Surgery

## 2017-04-14 NOTE — Telephone Encounter (Signed)
Patient called this morning stating that if or when you get a fax from [place] or [place] there is no need to fill this paperwork out, she is resigning from work, so she wanted to let us know so that you do not fill out any unnecessary paperwork.  Also she would like for you to call her, she has a question to ask you.  Thank you

## 2017-04-14 NOTE — Telephone Encounter (Signed)
Spoke with pt

## 2017-04-15 ENCOUNTER — Encounter (INDEPENDENT_AMBULATORY_CARE_PROVIDER_SITE_OTHER): Payer: Self-pay

## 2018-02-08 ENCOUNTER — Other Ambulatory Visit: Payer: Self-pay | Admitting: Internal Medicine

## 2018-02-08 DIAGNOSIS — Z1231 Encounter for screening mammogram for malignant neoplasm of breast: Secondary | ICD-10-CM

## 2018-02-09 ENCOUNTER — Ambulatory Visit (INDEPENDENT_AMBULATORY_CARE_PROVIDER_SITE_OTHER): Payer: Managed Care, Other (non HMO) | Admitting: Orthopaedic Surgery

## 2018-02-09 ENCOUNTER — Encounter (INDEPENDENT_AMBULATORY_CARE_PROVIDER_SITE_OTHER): Payer: Self-pay | Admitting: Orthopaedic Surgery

## 2018-02-09 VITALS — BP 147/97 | HR 74 | Resp 16 | Ht 63.0 in | Wt 170.0 lb

## 2018-02-09 DIAGNOSIS — M79642 Pain in left hand: Secondary | ICD-10-CM

## 2018-02-09 DIAGNOSIS — M25562 Pain in left knee: Secondary | ICD-10-CM

## 2018-02-09 MED ORDER — TRAMADOL HCL 50 MG PO TABS: ORAL_TABLET | ORAL | 0 refills | Status: AC

## 2018-02-09 MED ORDER — DICLOFENAC SODIUM 1 % TD GEL: 2.0000 g | Freq: Four times a day (QID) | TRANSDERMAL | 3 refills | Status: DC | PRN

## 2018-02-09 NOTE — Progress Notes (Signed)
Office Visit Note   Patient: Anne Watson           Date of Birth: 11/26/1960           MRN: [id number] Visit Date: 02/09/2018              Requested by: Leeroy Cha, MD [address] [place], [place] [address] PCP: Leeroy Cha, MD   Assessment & Plan: Visit Diagnoses:  1. Acute pain of left knee   2. Pain of left hand     Plan: Contusion left wrist with exacerbation of arthritis at the base of the left thumb.  Bruised left knee.  No evidence of fracture.  Will prescribe Pennsaid gel and tramadol for pain.  Office as needed  Follow-Up Instructions: Return if symptoms worsen or fail to improve.   Orders:  No orders of the defined types were placed in this encounter.  Meds ordered this encounter  Medications  . diclofenac sodium (VOLTAREN) 1 % GEL    Sig: Apply 2 g topically 4 (four) times daily as needed.    Dispense:  3 Tube    Refill:  3  . traMADol (ULTRAM) 50 MG tablet    Sig: 1-2 TAB PO BID PRN    Dispense:  30 tablet    Refill:  0      Procedures: No procedures performed   Clinical Data: No additional findings.   Subjective: Chief Complaint  Patient presents with  . Left Knee - Injury, Pain  . Left Elbow - Injury, Pain  . Left Shoulder - Injury, Pain  . New Patient (Initial Visit)    L KNEE PAIN AND BIL LAT HAND PAIN, L UPPER BACK PAIN AND L ELBOW PAIN, FOOT GOT CAUGHT IN GATE AND PT FELL  Anne Watson fell this morning.  She is dog sitting and  in the midst of that activity fell over a very small fence.  She does not remember the specific mechanism of injury but fell apparently directly on her left side.  She has felt a little nauseated having some discomfort around her left knee and her left wrist.  No numbness no tingling.  She is able to walk and drive.  Has not noticed any ecchymosis or swelling.  HPI  Review of Systems  Constitutional: Positive for fatigue. Negative for fever.  HENT: Negative for ear pain.     Eyes: Negative for pain.  Respiratory: Negative for cough and shortness of breath.   Cardiovascular: Negative for leg swelling.  Gastrointestinal: Negative for constipation and diarrhea.  Genitourinary: Negative for difficulty urinating.  Musculoskeletal: Positive for back pain and neck pain.  Skin: Negative for rash.  Allergic/Immunologic: Positive for food allergies.  Neurological: Positive for weakness. Negative for numbness.  Hematological: Does not bruise/bleed easily.  Psychiatric/Behavioral: Negative for sleep disturbance.     Objective: Vital Signs: BP (!) 147/97 (BP Location: Left Arm, Patient Position: Sitting, Cuff Size: Normal)   Pulse 74   Resp 16   Ht 5\' 3"  (1.6 m)   Wt 170 lb (77.1 kg)   BMI 30.11 kg/m   Physical Exam  Constitutional: She is oriented to person, place, and time. She appears well-developed and well-nourished.  HENT:  Mouth/Throat: Oropharynx is clear and moist.  Eyes: Pupils are equal, round, and reactive to light. EOM are normal.  Pulmonary/Chest: Effort normal.  Neurological: She is alert and oriented to person, place, and time.  Skin: Skin is warm and dry.  Psychiatric: She  has a normal mood and affect. Her behavior is normal.    Ortho Exam awake alert and oriented x3.  No distress.  Very mild tenderness volar aspect of her left knee the palmaris longus.  No ecchymosis no erythema.  No pain dorsally.  No pain over the radius or ulna.  Painless range of motion of the wrist and oppose thumb to little finger without any problem.  Vascular exam intact. Small abrasion over the anterior aspect of the left knee without bleeding.  No knee effusion.  Full quick extension.  Walks without a limp.  No pain over MCL or LCL.  Vascular exam intact.  Mild pain in the area of the quadriceps mechanism just proximal to the patella but quad is intact.  Specialty Comments:  No specialty comments available.  Imaging: No results found.   PMFS History: Patient  Active Problem List   Diagnosis Date Noted  . DJD (degenerative joint disease), lumbar 06/11/2014  . Thrombocytosis ([place]) 04/04/2014  . KNEE PAIN, LEFT 08/25/2008  . PATELLAR TENDINITIS 08/25/2008   Past Medical History:  Diagnosis Date  . Allergic rhinitis   . Chest pain   . Depression   . GERD (gastroesophageal reflux disease)   . Hypercholesteremia   . Thrombocytosis ([place]) 04/04/2014  . Trigeminal neuralgia     Family History  Problem Relation Age of Onset  . Breast cancer Mother 5       recurrence at age 68  . Stroke Father   . Hypertension Father   . Cancer Father 12       colon cancer  . Colon cancer Father   . Breast cancer Sister 20  . Cancer Maternal Grandmother        myeloma    Past Surgical History:  Procedure Laterality Date  . BREAST BIOPSY Left   . CESAREAN SECTION    . CHOLECYSTECTOMY    . CYST REMOVAL HAND Right    Social History   Occupational History  . Occupation: Product/process development scientist: [employer]  . Occupation: BARTENDER    Employer: [employer]  Tobacco Use  . Smoking status: Former Smoker    Packs/day: 2.00    Years: 30.00    Pack years: 60.00    Types: Cigarettes    Last attempt to quit: 10/06/2004    Years since quitting: 13.3  . Smokeless tobacco: Never Used  Substance and Sexual Activity  . Alcohol use: Yes    Comment: occasional  . Drug use: No  . Sexual activity: Not on file

## 2018-03-03 ENCOUNTER — Ambulatory Visit: Payer: Managed Care, Other (non HMO)

## 2018-04-13 ENCOUNTER — Ambulatory Visit
Admission: RE | Admit: 2018-04-13 | Discharge: 2018-04-13 | Disposition: A | Discharge status: Home / Self Care | Payer: Managed Care, Other (non HMO) | Source: Ambulatory Visit | Attending: Internal Medicine | Admitting: Internal Medicine

## 2018-04-13 DIAGNOSIS — Z1231 Encounter for screening mammogram for malignant neoplasm of breast: Secondary | ICD-10-CM

## 2018-11-17 ENCOUNTER — Emergency Department (HOSPITAL_COMMUNITY): Payer: BLUE CROSS/BLUE SHIELD

## 2018-11-17 ENCOUNTER — Encounter (HOSPITAL_COMMUNITY): Payer: Self-pay

## 2018-11-17 ENCOUNTER — Other Ambulatory Visit: Payer: Self-pay

## 2018-11-17 ENCOUNTER — Emergency Department (HOSPITAL_COMMUNITY)
Admission: EM | Admit: 2018-11-17 | Discharge: 2018-11-17 | Disposition: A | Discharge status: Home / Self Care | Payer: BLUE CROSS/BLUE SHIELD | Attending: Emergency Medicine | Admitting: Emergency Medicine

## 2018-11-17 DIAGNOSIS — Z79899 Other long term (current) drug therapy: Secondary | ICD-10-CM | POA: Insufficient documentation

## 2018-11-17 DIAGNOSIS — Z9049 Acquired absence of other specified parts of digestive tract: Secondary | ICD-10-CM | POA: Insufficient documentation

## 2018-11-17 DIAGNOSIS — Y92018 Other place in single-family (private) house as the place of occurrence of the external cause: Secondary | ICD-10-CM | POA: Insufficient documentation

## 2018-11-17 DIAGNOSIS — Y998 Other external cause status: Secondary | ICD-10-CM | POA: Insufficient documentation

## 2018-11-17 DIAGNOSIS — Y9389 Activity, other specified: Secondary | ICD-10-CM | POA: Insufficient documentation

## 2018-11-17 DIAGNOSIS — Z7982 Long term (current) use of aspirin: Secondary | ICD-10-CM | POA: Insufficient documentation

## 2018-11-17 DIAGNOSIS — I10 Essential (primary) hypertension: Secondary | ICD-10-CM

## 2018-11-17 DIAGNOSIS — R102 Pelvic and perineal pain: Secondary | ICD-10-CM | POA: Insufficient documentation

## 2018-11-17 DIAGNOSIS — S39012A Strain of muscle, fascia and tendon of lower back, initial encounter: Secondary | ICD-10-CM

## 2018-11-17 DIAGNOSIS — Z87891 Personal history of nicotine dependence: Secondary | ICD-10-CM | POA: Insufficient documentation

## 2018-11-17 DIAGNOSIS — W01198A Fall on same level from slipping, tripping and stumbling with subsequent striking against other object, initial encounter: Secondary | ICD-10-CM | POA: Insufficient documentation

## 2018-11-17 DIAGNOSIS — F329 Major depressive disorder, single episode, unspecified: Secondary | ICD-10-CM | POA: Insufficient documentation

## 2018-11-17 DIAGNOSIS — M542 Cervicalgia: Secondary | ICD-10-CM | POA: Insufficient documentation

## 2018-11-17 DIAGNOSIS — S0990XA Unspecified injury of head, initial encounter: Secondary | ICD-10-CM

## 2018-11-17 MED ORDER — MECLIZINE HCL 12.5 MG PO TABS: 12.5000 mg | ORAL_TABLET | Freq: Three times a day (TID) | ORAL | 0 refills | Status: AC | PRN

## 2018-11-17 MED ORDER — CYCLOBENZAPRINE HCL 5 MG PO TABS: 5.0000 mg | ORAL_TABLET | Freq: Three times a day (TID) | ORAL | 0 refills | Status: AC | PRN

## 2018-11-17 MED ORDER — CYCLOBENZAPRINE HCL 10 MG PO TABS
5.0000 mg | ORAL_TABLET | Freq: Once | ORAL | Status: AC
  Administered 2018-11-17: 5 mg via ORAL
  Filled 2018-11-17: qty 1

## 2018-11-17 MED ORDER — ONDANSETRON 4 MG PO TBDP
4.0000 mg | ORAL_TABLET | Freq: Once | ORAL | Status: AC
  Administered 2018-11-17: 4 mg via ORAL
  Filled 2018-11-17: qty 1

## 2018-11-17 MED ORDER — DICLOFENAC SODIUM 1 % TD GEL: 2.0000 g | Freq: Four times a day (QID) | TRANSDERMAL | 0 refills | Status: AC | PRN

## 2018-11-17 MED ORDER — ONDANSETRON 4 MG PO TBDP: ORAL_TABLET | ORAL | 0 refills | Status: AC

## 2018-11-17 NOTE — ED Provider Notes (Signed)
[facility] DEPT Provider Note   CSN: [id number] Arrival date & time: 11/17/18  1431     History   Chief Complaint Chief Complaint  Patient presents with  . Fall  . Head Injury  . Tailbone Pain    HPI Anne Watson is a 58 y.o. female here with fall, head injury, back injury.  Patient states that her power went out around 1 PM and she tried to open the garage door and accidentally fell backwards and hit her head on the concrete.  She felt lightheaded and dizzy afterwards but denies any nausea or vomiting.  Patient also has some posterior headaches as well.  Patient also hit her back as well and has severe lower back pain.  Patient states that she is able to walk and took some ibuprofen with minimal relief.   The history is provided by the patient.    Past Medical History:  Diagnosis Date  . Allergic rhinitis   . Chest pain   . Depression   . GERD (gastroesophageal reflux disease)   . Hypercholesteremia   . Thrombocytosis ([place]) 04/04/2014  . Trigeminal neuralgia     Patient Active Problem List   Diagnosis Date Noted  . DJD (degenerative joint disease), lumbar 06/11/2014  . Thrombocytosis ([place]) 04/04/2014  . KNEE PAIN, LEFT 08/25/2008  . PATELLAR TENDINITIS 08/25/2008    Past Surgical History:  Procedure Laterality Date  . BREAST BIOPSY Left   . CESAREAN SECTION    . CHOLECYSTECTOMY    . CYST REMOVAL HAND Right      OB History   No obstetric history on file.      Home Medications    Prior to Admission medications   Medication Sig Start Date End Date Taking? Authorizing Provider  APLENZIN 522 MG TB24 Take 522 mg by mouth daily. 11/17/18  Yes [provider]  aspirin EC 81 MG tablet Take 81 mg by mouth daily.   Yes [provider]  busPIRone (BUSPAR) 10 MG tablet Take 10 mg by mouth 2 (two) times daily. 11/14/18  Yes [provider]  cholecalciferol (VITAMIN D) 1000 UNITS tablet Take 2,000 Units by mouth  2 (two) times daily.    Yes [provider]  cyclobenzaprine (FLEXERIL) 10 MG tablet Take 10 mg by mouth daily as needed for muscle spasms.  02/02/18  Yes [provider]  EPINEPHrine 0.3 mg/0.3 mL IJ SOAJ injection See admin instructions. 10/14/16  Yes [provider]  ibuprofen (ADVIL,MOTRIN) 200 MG tablet Take 600 mg by mouth every 8 (eight) hours as needed for moderate pain.    Yes [provider]  omeprazole (PRILOSEC) 10 MG capsule Take 10 mg by mouth daily.   Yes [provider]  diclofenac sodium (VOLTAREN) 1 % GEL Apply 2 g topically 4 (four) times daily as needed. Patient not taking: Reported on 11/17/2018 02/09/18   Garald Balding, MD  hydrOXYzine (ATARAX/VISTARIL) 25 MG tablet Take 1 tablet (25 mg total) by mouth 3 (three) times daily as needed for itching. Patient not taking: Reported on 09/11/2016 07/09/15   Roselee Culver, MD  mupirocin cream (BACTROBAN) 2 % Apply 1 application topically 2 (two) times daily. Patient not taking: Reported on 01/23/2017 11/12/15   Leandrew Koyanagi, MD  predniSONE (DELTASONE) 20 MG tablet Take 3 daily for 3 days, then 2 daily for 3 days, then 1 daily for 3 days, then one half daily. Take after breakfast. Patient not taking: Reported on 09/11/2016  01/24/16   Robyn Haber, MD  traMADol (ULTRAM) 50 MG tablet TAKE 1 TABLET EVERY 8 HOURS AS NEEDED Patient not taking: Reported on 02/09/2018 01/28/16   Robyn Haber, MD  traMADol Veatrice Bourbon) 50 MG tablet 1-2 TAB PO BID PRN Patient not taking: Reported on 11/17/2018 02/09/18   Garald Balding, MD  triamcinolone cream (KENALOG) 0.1 % Apply 1 application topically 2 (two) times daily. Patient not taking: Reported on 09/11/2016 07/09/15   Roselee Culver, MD    Family History Family History  Problem Relation Age of Onset  . Breast cancer Mother 57       recurrence at age 2  . Stroke Father   . Hypertension Father   . Cancer Father 45       colon cancer  .  Colon cancer Father   . Breast cancer Sister 18  . Cancer Maternal Grandmother        myeloma    Social History Social History   Tobacco Use  . Smoking status: Former Smoker    Packs/day: 2.00    Years: 30.00    Pack years: 60.00    Types: Cigarettes    Last attempt to quit: 10/06/2004    Years since quitting: 14.1  . Smokeless tobacco: Never Used  Substance Use Topics  . Alcohol use: Yes    Comment: occasional  . Drug use: No     Allergies   Ceftin [cefuroxime axetil]; Codeine; Penicillins; and Tetracyclines & related   Review of Systems Review of Systems  Musculoskeletal: Positive for back pain.  Neurological: Positive for dizziness and headaches.  All other systems reviewed and are negative.    Physical Exam Updated Vital Signs BP (!) 185/88 (BP Location: Left Arm)   Pulse 83   Temp 98.2 F (36.8 C) (Oral)   Resp 16   Ht 5\' 3"  (1.6 m)   Wt 86.2 kg   SpO2 100%   BMI 33.66 kg/m   Physical Exam Vitals signs and nursing note reviewed.  HENT:     Head: Normocephalic.     Comments: Small posterior scalp hematoma, no laceration     Right Ear: Tympanic membrane normal.     Left Ear: Tympanic membrane normal.     Mouth/Throat:     Mouth: Mucous membranes are moist.  Eyes:     Pupils: Pupils are equal, round, and reactive to light.  Neck:     Musculoskeletal: Normal range of motion and neck supple.  Cardiovascular:     Rate and Rhythm: Normal rate and regular rhythm.  Pulmonary:     Effort: Pulmonary effort is normal.     Breath sounds: Normal breath sounds.  Abdominal:     General: Abdomen is flat.     Palpations: Abdomen is soft.  Musculoskeletal:     Comments: Mild lower lumbar tenderness vs SI joint tenderness. Nl ROM bilateral hips. No obvious extremity trauma   Skin:    General: Skin is warm.     Capillary Refill: Capillary refill takes less than 2 seconds.  Neurological:     General: No focal deficit present.     Mental Status: She is alert.   Psychiatric:        Mood and Affect: Mood normal.        Behavior: Behavior normal.      ED Treatments / Results  Labs (all labs ordered are listed, but only abnormal results are displayed) Labs Reviewed - No data to display  EKG  None  Radiology Dg Lumbar Spine Complete  Result Date: 11/17/2018 CLINICAL DATA:  Fall, back pain EXAM: LUMBAR SPINE - COMPLETE 4+ VIEW COMPARISON:  None. FINDINGS: Disc space narrowing at L5-S1. 6 mm of anterolisthesis related to facet disease. Mild disc space narrowing at L4-5 as well. No acute bony abnormality. No fracture. SI joints symmetric and unremarkable. Calcified fibroid in the midline of the pelvis. IMPRESSION: Degenerative disc and facet disease in the lower lumbar spine. Grade 1 anterolisthesis at L5-S1. No acute bony abnormality. Electronically Signed   By: Rolm Baptise M.D.   On: 11/17/2018 19:16   Dg Pelvis 1-2 Views  Result Date: 11/17/2018 CLINICAL DATA:  Recent fall with pelvic pain, initial encounter EXAM: PELVIS - 1 VIEW COMPARISON:  None. FINDINGS: Pelvic ring is intact. Calcified uterine fibroid is noted. No acute fracture is seen. No soft tissue abnormality is noted. IMPRESSION: No acute abnormality noted. Electronically Signed   By: Inez Catalina M.D.   On: 11/17/2018 19:16   Ct Head Wo Contrast  Result Date: 11/17/2018 CLINICAL DATA:  Recent fall with headaches and neck pain, initial encounter EXAM: CT HEAD WITHOUT CONTRAST CT CERVICAL SPINE WITHOUT CONTRAST TECHNIQUE: Multidetector CT imaging of the head and cervical spine was performed following the standard protocol without intravenous contrast. Multiplanar CT image reconstructions of the cervical spine were also generated. COMPARISON:  None. FINDINGS: CT HEAD FINDINGS Brain: No evidence of acute infarction, hemorrhage, hydrocephalus, extra-axial collection or mass lesion/mass effect. Vascular: No hyperdense vessel or unexpected calcification. Skull: Normal. Negative for fracture or  focal lesion. Sinuses/Orbits: No acute finding. Other: None. CT CERVICAL SPINE FINDINGS Alignment: Within normal limits. Skull base and vertebrae: 7 cervical segments are well visualized. Vertebral body height is well maintained. Mild disc space narrowing is noted at C5-6 with associated osteophytic changes. Mild osteophytes are noted at C3-4 and C6-7. Mild facet hypertrophic changes are seen. The odontoid is within normal limits. No acute fracture or acute facet abnormality is noted. Soft tissues and spinal canal: Surrounding soft tissues of the cervical spine reveal no acute abnormality. Upper chest: Within normal limits. Other: None IMPRESSION: CT of the head: No acute intracranial abnormality noted. CT of the cervical spine: Multilevel degenerative change without acute abnormality. Electronically Signed   By: Inez Catalina M.D.   On: 11/17/2018 19:14   Ct Cervical Spine Wo Contrast  Result Date: 11/17/2018 CLINICAL DATA:  Recent fall with headaches and neck pain, initial encounter EXAM: CT HEAD WITHOUT CONTRAST CT CERVICAL SPINE WITHOUT CONTRAST TECHNIQUE: Multidetector CT imaging of the head and cervical spine was performed following the standard protocol without intravenous contrast. Multiplanar CT image reconstructions of the cervical spine were also generated. COMPARISON:  None. FINDINGS: CT HEAD FINDINGS Brain: No evidence of acute infarction, hemorrhage, hydrocephalus, extra-axial collection or mass lesion/mass effect. Vascular: No hyperdense vessel or unexpected calcification. Skull: Normal. Negative for fracture or focal lesion. Sinuses/Orbits: No acute finding. Other: None. CT CERVICAL SPINE FINDINGS Alignment: Within normal limits. Skull base and vertebrae: 7 cervical segments are well visualized. Vertebral body height is well maintained. Mild disc space narrowing is noted at C5-6 with associated osteophytic changes. Mild osteophytes are noted at C3-4 and C6-7. Mild facet hypertrophic changes are  seen. The odontoid is within normal limits. No acute fracture or acute facet abnormality is noted. Soft tissues and spinal canal: Surrounding soft tissues of the cervical spine reveal no acute abnormality. Upper chest: Within normal limits. Other: None IMPRESSION: CT of the head: No acute intracranial  abnormality noted. CT of the cervical spine: Multilevel degenerative change without acute abnormality. Electronically Signed   By: Inez Catalina M.D.   On: 11/17/2018 19:14    Procedures Procedures (including critical care time)  Medications Ordered in ED Medications  cyclobenzaprine (FLEXERIL) tablet 5 mg (5 mg Oral Given 11/17/18 1816)     Initial Impression / Assessment and Plan / ED Course  I have reviewed the triage vital signs and the nursing notes.  Pertinent labs & imaging results that were available during my care of the patient were reviewed by me and considered in my medical decision making (see chart for details).    Khylie Larmore is a 58 y.o. female here with back pain, headache after fall. Nl neuro exam. I think likely concussion and possible back strain vs coccyx fracture. Will get CT head/neck, lumbar and pelvic xrays. Will give flexeril and reassess.  7:40 PM Xrays and CT head unremarkable. Given flexeril and felt better. Patient is hypertensive in the ED but has no hx of hypertension and is not currently on meds. Likely secondary to pain or stress. Will dc home with motrin, flexeril, repeat BP in a week with PCP. Gave strict return precautions     Final Clinical Impressions(s) / ED Diagnoses   Final diagnoses:  None    ED Discharge Orders    None       Drenda Freeze, MD 11/17/18 1940

## 2018-11-17 NOTE — Discharge Instructions (Addendum)
Expect headaches and dizziness. Take tylenol, motrin for pain.   Take meclizine for dizziness. Stay hydrated   Rest tomorrow  Take flexeril for muscle spasms. Apply voltaren gel to the lower back   See your doctor  Your blood pressure is elevated, follow up in a week with your doctor  Return to ER if you have worse headaches, vomiting, uncontrolled dizziness, fever, weakness

## 2018-11-17 NOTE — ED Notes (Signed)
Patient transported to CT 

## 2018-11-17 NOTE — ED Notes (Signed)
Patient transported to X-ray 

## 2018-11-17 NOTE — ED Triage Notes (Signed)
Patient states she was pulling on the emergency rope for the garage door and fell backwards hitting her head on concrete. Patient also c/o coccyx pain. Patient states about an hour after the incident she developed dizziness. patient denies LOC. Patient takes Aspirin daily.

## 2019-01-31 DIAGNOSIS — F32A Depression, unspecified: Secondary | ICD-10-CM | POA: Insufficient documentation

## 2019-01-31 DIAGNOSIS — T7840XA Allergy, unspecified, initial encounter: Secondary | ICD-10-CM | POA: Insufficient documentation

## 2019-11-22 DIAGNOSIS — R748 Abnormal levels of other serum enzymes: Secondary | ICD-10-CM | POA: Diagnosis not present

## 2019-11-22 DIAGNOSIS — E669 Obesity, unspecified: Secondary | ICD-10-CM | POA: Diagnosis not present

## 2019-11-22 DIAGNOSIS — F331 Major depressive disorder, recurrent, moderate: Secondary | ICD-10-CM | POA: Diagnosis not present

## 2019-11-22 DIAGNOSIS — M5432 Sciatica, left side: Secondary | ICD-10-CM | POA: Diagnosis not present

## 2019-11-22 DIAGNOSIS — K219 Gastro-esophageal reflux disease without esophagitis: Secondary | ICD-10-CM | POA: Diagnosis not present

## 2019-11-22 DIAGNOSIS — R635 Abnormal weight gain: Secondary | ICD-10-CM | POA: Diagnosis not present

## 2019-11-22 DIAGNOSIS — M5431 Sciatica, right side: Secondary | ICD-10-CM | POA: Diagnosis not present

## 2019-12-14 DIAGNOSIS — K760 Fatty (change of) liver, not elsewhere classified: Secondary | ICD-10-CM | POA: Diagnosis not present

## 2019-12-14 DIAGNOSIS — R748 Abnormal levels of other serum enzymes: Secondary | ICD-10-CM | POA: Diagnosis not present

## 2019-12-16 DIAGNOSIS — Z20828 Contact with and (suspected) exposure to other viral communicable diseases: Secondary | ICD-10-CM | POA: Diagnosis not present

## 2020-01-04 DIAGNOSIS — N3281 Overactive bladder: Secondary | ICD-10-CM | POA: Diagnosis not present

## 2020-01-04 DIAGNOSIS — Z0000 Encounter for general adult medical examination without abnormal findings: Secondary | ICD-10-CM | POA: Diagnosis not present

## 2020-01-04 DIAGNOSIS — M5431 Sciatica, right side: Secondary | ICD-10-CM | POA: Diagnosis not present

## 2020-01-04 DIAGNOSIS — E669 Obesity, unspecified: Secondary | ICD-10-CM | POA: Diagnosis not present

## 2020-01-04 DIAGNOSIS — R945 Abnormal results of liver function studies: Secondary | ICD-10-CM | POA: Diagnosis not present

## 2020-01-04 DIAGNOSIS — F331 Major depressive disorder, recurrent, moderate: Secondary | ICD-10-CM | POA: Diagnosis not present

## 2020-01-04 DIAGNOSIS — M5432 Sciatica, left side: Secondary | ICD-10-CM | POA: Diagnosis not present

## 2020-01-04 DIAGNOSIS — R748 Abnormal levels of other serum enzymes: Secondary | ICD-10-CM | POA: Diagnosis not present

## 2020-01-04 DIAGNOSIS — Z91038 Other insect allergy status: Secondary | ICD-10-CM | POA: Diagnosis not present

## 2020-01-16 DIAGNOSIS — H5203 Hypermetropia, bilateral: Secondary | ICD-10-CM | POA: Diagnosis not present

## 2020-01-19 DIAGNOSIS — Z0100 Encounter for examination of eyes and vision without abnormal findings: Secondary | ICD-10-CM | POA: Diagnosis not present

## 2020-03-01 ENCOUNTER — Other Ambulatory Visit: Payer: Self-pay | Admitting: Internal Medicine

## 2020-03-01 DIAGNOSIS — E2839 Other primary ovarian failure: Secondary | ICD-10-CM

## 2020-03-13 ENCOUNTER — Ambulatory Visit
Admission: RE | Admit: 2020-03-13 | Discharge: 2020-03-13 | Disposition: A | Discharge status: Home / Self Care | Payer: Medicare Other | Source: Ambulatory Visit | Attending: Internal Medicine | Admitting: Internal Medicine

## 2020-03-13 ENCOUNTER — Inpatient Hospital Stay: Payer: Medicare Other | Attending: Genetic Counselor | Admitting: Genetic Counselor

## 2020-03-13 ENCOUNTER — Other Ambulatory Visit: Payer: Self-pay

## 2020-03-13 ENCOUNTER — Encounter: Payer: Self-pay | Admitting: Genetic Counselor

## 2020-03-13 ENCOUNTER — Other Ambulatory Visit: Payer: Self-pay | Admitting: Internal Medicine

## 2020-03-13 DIAGNOSIS — Z800 Family history of malignant neoplasm of digestive organs: Secondary | ICD-10-CM | POA: Diagnosis not present

## 2020-03-13 DIAGNOSIS — M545 Low back pain, unspecified: Secondary | ICD-10-CM

## 2020-03-13 DIAGNOSIS — Z807 Family history of other malignant neoplasms of lymphoid, hematopoietic and related tissues: Secondary | ICD-10-CM | POA: Diagnosis not present

## 2020-03-13 DIAGNOSIS — M542 Cervicalgia: Secondary | ICD-10-CM

## 2020-03-13 DIAGNOSIS — Z803 Family history of malignant neoplasm of breast: Secondary | ICD-10-CM | POA: Insufficient documentation

## 2020-03-13 DIAGNOSIS — Z808 Family history of malignant neoplasm of other organs or systems: Secondary | ICD-10-CM | POA: Diagnosis not present

## 2020-03-13 NOTE — Progress Notes (Signed)
REFERRING PROVIDER: No referring provider defined for this encounter.  PRIMARY PROVIDER:  Varadarajan, Rupashree, MD  PRIMARY REASON FOR VISIT:  1. Family history of breast cancer   2. Family history of colon cancer   3. Family history of melanoma   4. Family history of pancreatic cancer   5. Family history of multiple myeloma      I connected with Ms. Riddles on 03/13/2020 at 11:00 am EDT by Mychart video conference and verified that I am speaking with the correct person using two identifiers.   Patient location: Home Provider location: [facility] office  HISTORY OF PRESENT ILLNESS:   Ms. Sidener, a 59 y.o. female, was seen for a [place] cancer genetics consultation due to a family history of cancer.  Ms. Heidinger presents to clinic today to discuss the possibility of a hereditary predisposition to cancer, genetic testing, and to further clarify her future cancer risks, as well as potential cancer risks for family members.   Ms. Swaggerty does not have a personal history of cancer.     RISK FACTORS:  Menarche was at age 9.  First live birth at age 30.  OCP use for approximately less than 1 year.  Ovaries intact: yes.  Hysterectomy: no.  Menopausal status: postmenopausal.  HRT use: 0 years. Colonoscopy: yes; normal, per patient. Mammogram within the last year: no - last mammogram 04/13/18. Number of breast biopsies: 1 - cyst removed 2002. Any excessive radiation exposure in the past: no  Past Medical History:  Diagnosis Date  . Allergic rhinitis   . Chest pain   . Depression   . Family history of breast cancer   . Family history of colon cancer   . Family history of melanoma   . Family history of multiple myeloma   . Family history of pancreatic cancer   . GERD (gastroesophageal reflux disease)   . Hypercholesteremia   . Thrombocytosis (HCC) 04/04/2014  . Trigeminal neuralgia     Past Surgical History:  Procedure Laterality Date  . BREAST BIOPSY Left   . CESAREAN SECTION     . CHOLECYSTECTOMY    . CYST REMOVAL HAND Right     Social History   Socioeconomic History  . Marital status: Legally Separated    Spouse name: n/a  . Number of children: 2  . Years of education: 12+  . Highest education level: Not on file  Occupational History  . Occupation: Accounting    Employer: [employer]  . Occupation: BARTENDER    Employer: [employer]  Tobacco Use  . Smoking status: Former Smoker    Packs/day: 2.00    Years: 30.00    Pack years: 60.00    Types: Cigarettes    Quit date: 10/06/2004    Years since quitting: 15.4  . Smokeless tobacco: Never Used  Substance and Sexual Activity  . Alcohol use: Yes    Comment: occasional  . Drug use: No  . Sexual activity: Not on file  Other Topics Concern  . Not on file  Social History Narrative   Lives with her two daughters.   Social Determinants of Health   Financial Resource Strain:   . Difficulty of Paying Living Expenses:   Food Insecurity:   . Worried About Running Out of Food in the Last Year:   . Ran Out of Food in the Last Year:   Transportation Needs:   . Lack of Transportation (Medical):   . Lack of Transportation (Non-Medical):   Physical Activity:   .   Days of Exercise per Week:   . Minutes of Exercise per Session:   Stress:   . Feeling of Stress :   Social Connections:   . Frequency of Communication with Friends and Family:   . Frequency of Social Gatherings with Friends and Family:   . Attends Religious Services:   . Active Member of Clubs or Organizations:   . Attends Club or Organization Meetings:   . Marital Status:      FAMILY HISTORY:  We obtained a detailed, 4-generation family history.  Significant diagnoses are listed below: Family History  Problem Relation Age of Onset  . Breast cancer Mother 35       recurrence at age 44  . Stroke Father   . Hypertension Father   . Colon cancer Father 83       colon cancer  . Breast cancer Sister 33  . Multiple myeloma  Maternal Grandmother 56  . Pancreatic cancer Paternal Uncle        dx. in his 70s/80s; smoker  . Heart Problems Paternal Grandfather   . Heart Problems Paternal Uncle 43  . COPD Paternal Uncle   . Other Niece        negative BRCA testing ~20 years ago   Ms. Livingston has two daughters (ages 29 and 27). She has one sister who died at the age of 43 from breast cancer that had initially been diagnosed at the age of 33. Her niece had genetic testing of the BRCA genes that was negative around 20 years ago. Her nephew has a history of a growth on his thigh bone when he was a teenager, which was surgically removed.  Ms. Guderian's mother is 88 and has a history of breast cancer first diagnosed at the age of 35, with a recurrence at the age of 44. Ms. Mcclain does not have any maternal aunts or uncles. Her maternal grandmother died at the age of 56 with multiple myeloma, and she does not have any information about her maternal grandfather. There are no other known diagnoses of cancer on the maternal side of the family.  Ms. Santucci's father died at the age of 86 from a stroke, and had a history of colon cancer in his 80s and multiple melanoma skin cancer removed in his 70s. She had three paternal uncles. One uncle died at the age of 84 from pancreatic cancer that was diagnosed in his 70s or 80s, and was a smoker. Another uncle died at the age of 43 from heart problems. The third uncle died in his late 50s from COPD. Her paternal grandmother died at the age of 86 from internal bleeding due to an unknown cause, and her paternal grandfather died at the age of 65 from heart problems. There are no other known diagnoses of cancer on the paternal side of the family.  Ms. Dovidio is aware of previous family history of genetic testing for hereditary cancer risks - her niece had BRCA testing around 20 years ago that was normal. Patient's maternal ancestors are of [place] Native [place] descent, and paternal ancestors are of  Scottish, English, Irish, German, and Dutch descent. There is no reported Ashkenazi Jewish ancestry. There is no known consanguinity.  GENETIC COUNSELING ASSESSMENT: Ms. Mcneese is a 58 y.o. female with a family history of young-onset breast cancer which is somewhat suggestive of a hereditary cancer syndrome and predisposition to cancer. We, therefore, discussed and recommended the following at today's visit.   DISCUSSION: We discussed that approximately   5-10% of breast cancer is hereditary, with most cases associated with the BRCA genes. There are other genes that can be associated with hereditary breast cancer syndromes. These include ATM, CHEK2, PALB2, etc. We discussed that testing is beneficial for several reasons, including knowing about other cancer risks, identifying potential screening and risk-reduction options that may be appropriate, and to understand if other family members could be at risk for cancer and allow them to undergo genetic testing.   We reviewed the characteristics, features and inheritance patterns of hereditary cancer syndromes. We also discussed genetic testing, including the appropriate family members to test, the process of testing, insurance coverage and turn-around-time for results. We discussed the implications of a negative, positive and/or variant of uncertain significant result. We recommended Ms. Olejnik pursue genetic testing for the [facility] Multi-Cancer gene panel.   The Multi-Cancer Panel offered by [facility] includes sequencing and/or deletion duplication testing of the following 85 genes: AIP, ALK, APC, ATM, AXIN2,BAP1,  BARD1, BLM, BMPR1A, BRCA1, BRCA2, BRIP1, CASR, CDC73, CDH1, CDK4, CDKN1B, CDKN1C, CDKN2A (p14ARF), CDKN2A (p16INK4a), CEBPA, CHEK2, CTNNA1, DICER1, DIS3L2, EGFR (c.2369C>T, p.Thr790Met variant only), EPCAM (Deletion/duplication testing only), FH, FLCN, GATA2, GPC3, GREM1 (Promoter region deletion/duplication testing only), HOXB13 (c.251G>A, p.Gly84Glu),  HRAS, KIT, MAX, MEN1, MET, MITF (c.952G>A, p.Glu318Lys variant only), MLH1, MSH2, MSH3, MSH6, MUTYH, NBN, NF1, NF2, NTHL1, PALB2, PDGFRA, PHOX2B, PMS2, POLD1, POLE, POT1, PRKAR1A, PTCH1, PTEN, RAD50, RAD51C, RAD51D, RB1, RECQL4, RET, RNF43, RUNX1, SDHAF2, SDHA (sequence changes only), SDHB, SDHC, SDHD, SMAD4, SMARCA4, SMARCB1, SMARCE1, STK11, SUFU, TERC, TERT, TMEM127, TP53, TSC1, TSC2, VHL, WRN and WT1.    Based on Ms. Burgett's family history of cancer, she meets medical criteria for genetic testing. Despite that she meets criteria, she may still have an out of pocket cost. We discussed that if her out of pocket cost for testing is over $100, the laboratory will reach out and let her know. If the out of pocket cost of testing is less than $100 she will be billed by the genetic testing laboratory.   We discussed that some people do not want to undergo genetic testing due to fear of genetic discrimination.  A federal law called the Genetic Information Non-Discrimination Act (GINA) of 2008 helps protect individuals against genetic discrimination based on their genetic test results.  It impacts both health insurance and employment.  With health insurance, it protects against increased premiums, being kicked off insurance or being forced to take a test in order to be insured.  For employment it protects against hiring, firing and promoting decisions based on genetic test results.  Health status due to a cancer diagnosis is not protected under GINA.  Additionally, life, disability, and long-term care insurance is not protected under GINA.   PLAN: After considering the risks, benefits, and limitations, Ms. Syme provided informed consent to pursue genetic testing and the blood sample was sent to [facility] for analysis of the Multi-Cancer Panel. Results should be available within approximately two-three weeks' time, at which point they will be disclosed by telephone to Ms. Gosney, as will any additional  recommendations warranted by these results. Ms. Tomassetti will receive a summary of her genetic counseling visit and a copy of her results once available. This information will also be available in Epic.   Ms. Mandrell questions were answered to her satisfaction today. Our contact information was provided should additional questions or concerns arise. Thank you for the referral and allowing [place] to share in the care of your patient.   Raquel Sarna  Stiglich, MS, LCGC Licensed, Certified Genetic Counselor [email or web address] Phone: [phone]  The patient was seen for a total of 50 minutes in face-to-face genetic counseling.  This patient was discussed with Drs. Magrinat, Gudena and/or Feng who agrees with the above.    _______________________________________________________________________ For Office Staff:  Number of people involved in session: 1 Was an Intern/ student involved with case: no  

## 2020-03-14 ENCOUNTER — Other Ambulatory Visit: Payer: Self-pay

## 2020-03-14 ENCOUNTER — Inpatient Hospital Stay: Payer: Medicare Other

## 2020-03-23 ENCOUNTER — Ambulatory Visit: Payer: Self-pay | Admitting: Genetic Counselor

## 2020-03-23 ENCOUNTER — Telehealth: Payer: Self-pay | Admitting: Genetic Counselor

## 2020-03-23 ENCOUNTER — Encounter: Payer: Self-pay | Admitting: Genetic Counselor

## 2020-03-23 DIAGNOSIS — Z1379 Encounter for other screening for genetic and chromosomal anomalies: Secondary | ICD-10-CM | POA: Insufficient documentation

## 2020-03-23 NOTE — Telephone Encounter (Signed)
Revealed negative genetic testing. Discussed that we do not know why there is cancer in the family. There could be a genetic mutation in the family that Anne Watson did not inherit. Therecould also beamutation in adifferent gene that we are not testing, or our current technology may not be able detectcertain mutations. It will thereforebeimportant for her to stay in contact with genetics to keep up with whether additional testing may be appropriate in the future.   Two variants of uncertain significance were detected - one in the ALK gene called [id number], and a second in the [facility] gene called c.1394A>G. Her result is still considered normal at this time and should not impact her medical management.

## 2020-03-23 NOTE — Telephone Encounter (Deleted)
Revealed negative genetic testing. Discussed that we do not know why she has breast cancer or why there is cancer in the family. There could be a genetic mutation in the family that Anne Watson did not inherit. There could also be a mutation in a different gene that we are not testing, or our current technology may not be able detect certain mutations. It will therefore be important for her to stay in contact with genetics to keep up with whether additional testing may be appropriate in the future.   Two variants of uncertain significance were detected - one in the ALK gene called [id number], and a second in the [facility] gene called c.1394A>G. Her result is still considered normal at this time and should not impact her medical management.

## 2020-03-23 NOTE — Progress Notes (Signed)
HPI:  Anne Watson was previously seen in the [place] clinic due to a family history of young-onset breast cancer and concerns regarding a hereditary predisposition to cancer. Please refer to our prior cancer genetics clinic note for more information regarding our discussion, assessment and recommendations, at the time. Anne Watson recent genetic test results were disclosed to her, as were recommendations warranted by these results. These results and recommendations are discussed in more detail below.   FAMILY HISTORY:  We obtained a detailed, 4-generation family history.  Significant diagnoses are listed below: Family History  Problem Relation Age of Onset  . Breast cancer Mother 22       recurrence at age 71  . Stroke Father   . Hypertension Father   . Colon cancer Father 19       colon cancer  . Breast cancer Sister 102  . Multiple myeloma Maternal Grandmother 49  . Pancreatic cancer Paternal Uncle        dx. in his 70s/80s; smoker  . Heart Problems Paternal Grandfather   . Heart Problems Paternal Uncle 98  . COPD Paternal Uncle   . Other Niece        negative BRCA testing ~20 years ago   Anne Watson has two daughters (ages 80 and 50). She has one sister who died at the age of 45 from breast cancer that had initially been diagnosed at the age of 49. Her niece had genetic testing of the BRCA genes that was negative around 20 years ago. Her nephew has a history of a growth on his thigh bone when he was a teenager, which was surgically removed.  Anne Watson mother is 17 and has a history of breast cancer first diagnosed at the age of 23, with a recurrence at the age of 71. Anne Watson does not have any maternal aunts or uncles. Her maternal grandmother died at the age of 33 with multiple myeloma, and she does not have any information about her maternal grandfather. There are no other known diagnoses of cancer on the maternal side of the family.  Anne Watson father died  at the age of 25 from a stroke, and had a history of colon cancer in his 66s and multiple melanoma skin cancer removed in his 62s. She had three paternal uncles. One uncle died at the age of 32 from pancreatic cancer that was diagnosed in his 21s or 80s, and was a smoker. Another uncle died at the age of 24 from heart problems. The third uncle died in his late 46s from COPD. Her paternal grandmother died at the age of 33 from internal bleeding due to an unknown cause, and her paternal grandfather died at the age of 44 from heart problems. There are no other known diagnoses of cancer on the paternal side of the family.  Anne Watson is aware of previous family history of genetic testing for hereditary cancer risks - her niece had BRCA testing around 73 years ago that was normal. Patient's maternal ancestors are of [place] Native American descent, and paternal ancestors are of [place], [place], [place], [place], and [place] descent. There is no reported Ashkenazi Jewish ancestry. There is no known consanguinity.  GENETIC TEST RESULTS: Genetic testing reported out on 03/21/2020 through the [facility] Multi-Cancer panel. No pathogenic variants were detected.   The Multi-Cancer Panel offered by [facility] includes sequencing and/or deletion duplication testing of the following 85 genes: AIP, ALK, APC, ATM, AXIN2,BAP1,  BARD1, BLM, BMPR1A, BRCA1, BRCA2,  BRIP1, CASR, CDC73, CDH1, CDK4, CDKN1B, CDKN1C, CDKN2A (p14ARF), CDKN2A (p16INK4a), CEBPA, CHEK2, CTNNA1, DICER1, DIS3L2, EGFR (c.2369C>T, p.Thr790Met variant only), EPCAM (Deletion/duplication testing only), FH, FLCN, GATA2, GPC3, GREM1 (Promoter region deletion/duplication testing only), HOXB13 (c.251G>A, p.Gly84Glu), HRAS, KIT, MAX, MEN1, MET, MITF (c.952G>A, p.Glu318Lys variant only), MLH1, MSH2, MSH3, MSH6, MUTYH, NBN, NF1, NF2, NTHL1, PALB2, PDGFRA, PHOX2B, PMS2, POLD1, POLE, POT1, PRKAR1A, PTCH1, PTEN, RAD50, RAD51C, RAD51D, RB1, RECQL4, RET, RNF43, RUNX1, SDHAF2, SDHA  (sequence changes only), SDHB, SDHC, SDHD, SMAD4, SMARCA4, SMARCB1, SMARCE1, STK11, SUFU, TERC, TERT, TMEM127, TP53, TSC1, TSC2, VHL, WRN and WT1. The test report will be scanned into EPIC and located under the Molecular Pathology section of the Results Review tab.  A portion of the result report is included below for reference.     We discussed with Anne Watson that because current genetic testing is not perfect, it is possible there may be a gene mutation in one of these genes that current testing cannot detect, but that chance is small.  We also discussed, that there could be another gene that has not yet been discovered, or that we have not yet tested, that is responsible for the cancer diagnoses in the family. It is also possible there is a hereditary cause for the cancer in the family that Anne Watson did not inherit and therefore was not identified in her testing.  Therefore, it is important to remain in touch with cancer genetics in the future so that we can continue to offer Anne Watson the most up to date genetic testing.   Genetic testing did identify two variants of uncertain significance (VUS) - one in the ALK gene called [id number], and a second in the MSH3 gene called c.1394A>G.  At this time, it is unknown if these variants are associated with increased cancer risk or if they are normal findings, but most variants Watson as these get reclassified to being inconsequential. They should not be used to make medical management decisions. With time, we suspect the lab will determine the significance of these variants, if any. If we do learn more about them, we will try to contact Anne Watson to discuss it further. However, it is important to stay in touch with [place] periodically and keep the address and phone number up to date.  CANCER SCREENING RECOMMENDATIONS: Anne Watson test result is considered negative (normal).  This means that we have not identified a hereditary cause for her family history of  cancer at this time. While reassuring, this does not definitively rule out a hereditary predisposition to cancer. It is still possible that there could be genetic mutations that are undetectable by current technology. There could be genetic mutations in genes that have not been tested or identified to increase cancer risk.  Therefore, it is recommended she continue to follow the cancer management and screening guidelines provided by her primary healthcare provider.   An individual's cancer risk and medical management are not determined by genetic test results alone. Overall cancer risk assessment incorporates additional factors, including personal medical history, family history, and any available genetic information that may result in a personalized plan for cancer prevention and surveillance.  Based on Anne Watson's personal and family history, as well as her genetic test results, statistical models (Tyrer-Cuzick and the [employer]) were used to estimate her risk of developing breast cancer. Tyrer-Cuzick estimates her lifetime risk of developing breast cancer to be approximately 27.8%. This lifetime breast cancer risk is a preliminary estimate based on available information using  one of several models endorsed by the [employer] (ACS). The ACS recommends consideration of breast MRI screening as an adjunct to mammography for patients at high risk (defined as 20% or greater lifetime risk). The [employer] model estimates her 5-year risk for breast cancer to be approximately 4.6%. Consideration for chemoprevention is recommended for women who have a 5-year breast cancer risk of 1.7% or greater.  Anne Watson has been determined to be at high risk for breast cancer. Therefore, we recommend that she have annual breast cancer screening with mammography and breast MRI. We discussed that Anne Watson should discuss her individual situation with her primary care physician and determine a breast cancer screening plan  with which they are both comfortable.    Tyrer-Cuzick:   The [employer]ail Model:    RECOMMENDATIONS FOR FAMILY MEMBERS:  Individuals in this family might be at some increased risk of developing cancer, over the general population risk, simply due to the family history of cancer.  We recommended women in this family have a yearly mammogram beginning at age 27, or 48 years younger than the earliest onset of cancer, an annual clinical breast exam, and perform monthly breast self-exams. Women in this family should also have a gynecological exam as recommended by their primary provider. All family members should be referred for colonoscopy starting at age 77.  It is also possible there is a hereditary cause for the cancer in Anne Watson's family that she did not inherit and therefore was not identified in her.  Based on Anne Watson's family history, we recommended the daughters of her mother, who was diagnosed with breast cancer at age 9, have genetic counseling and testing. Anne Watson will let us know if we can be of any assistance in coordinating genetic counseling and/or testing for this family member.   FOLLOW-UP: Lastly, we discussed with Anne Watson that cancer genetics is a rapidly advancing field and it is possible that new genetic tests will be appropriate for her and/or her family members in the future. We encouraged her to remain in contact with cancer genetics on an annual basis so we can update her personal and family histories and let her know of advances in cancer genetics that may benefit this family.   Our contact number was provided. Ms. Sliter questions were answered to her satisfaction, and she knows she is welcome to call us at anytime with additional questions or concerns.   Clint Guy, MS, [facility] Genetic Counselor [email or web address] Phone: [phone]

## 2020-05-18 ENCOUNTER — Other Ambulatory Visit: Payer: Self-pay

## 2020-05-18 ENCOUNTER — Ambulatory Visit
Admission: RE | Admit: 2020-05-18 | Discharge: 2020-05-18 | Disposition: A | Discharge status: Home / Self Care | Payer: Managed Care, Other (non HMO) | Source: Ambulatory Visit | Attending: Internal Medicine | Admitting: Internal Medicine

## 2020-05-18 DIAGNOSIS — E2839 Other primary ovarian failure: Secondary | ICD-10-CM

## 2020-07-17 ENCOUNTER — Other Ambulatory Visit: Payer: Self-pay | Admitting: Internal Medicine

## 2020-07-17 DIAGNOSIS — Z1231 Encounter for screening mammogram for malignant neoplasm of breast: Secondary | ICD-10-CM

## 2021-02-07 DIAGNOSIS — I6529 Occlusion and stenosis of unspecified carotid artery: Secondary | ICD-10-CM | POA: Diagnosis not present

## 2021-02-07 DIAGNOSIS — Z0000 Encounter for general adult medical examination without abnormal findings: Secondary | ICD-10-CM | POA: Diagnosis not present

## 2021-02-07 DIAGNOSIS — Z1211 Encounter for screening for malignant neoplasm of colon: Secondary | ICD-10-CM | POA: Diagnosis not present

## 2021-02-07 DIAGNOSIS — G500 Trigeminal neuralgia: Secondary | ICD-10-CM | POA: Diagnosis not present

## 2021-02-07 DIAGNOSIS — E785 Hyperlipidemia, unspecified: Secondary | ICD-10-CM | POA: Diagnosis not present

## 2021-02-07 DIAGNOSIS — Z1231 Encounter for screening mammogram for malignant neoplasm of breast: Secondary | ICD-10-CM | POA: Diagnosis not present

## 2021-02-07 DIAGNOSIS — Z91038 Other insect allergy status: Secondary | ICD-10-CM | POA: Diagnosis not present

## 2021-02-07 DIAGNOSIS — K219 Gastro-esophageal reflux disease without esophagitis: Secondary | ICD-10-CM | POA: Diagnosis not present

## 2021-02-07 DIAGNOSIS — N3281 Overactive bladder: Secondary | ICD-10-CM | POA: Diagnosis not present

## 2021-02-27 DIAGNOSIS — I771 Stricture of artery: Secondary | ICD-10-CM | POA: Diagnosis not present

## 2021-02-27 DIAGNOSIS — I10 Essential (primary) hypertension: Secondary | ICD-10-CM | POA: Diagnosis not present

## 2021-02-27 DIAGNOSIS — I6529 Occlusion and stenosis of unspecified carotid artery: Secondary | ICD-10-CM | POA: Diagnosis not present

## 2021-02-27 DIAGNOSIS — E785 Hyperlipidemia, unspecified: Secondary | ICD-10-CM | POA: Diagnosis not present

## 2021-02-27 DIAGNOSIS — I6523 Occlusion and stenosis of bilateral carotid arteries: Secondary | ICD-10-CM | POA: Diagnosis not present

## 2021-03-08 DIAGNOSIS — E785 Hyperlipidemia, unspecified: Secondary | ICD-10-CM | POA: Diagnosis not present

## 2021-03-08 DIAGNOSIS — R748 Abnormal levels of other serum enzymes: Secondary | ICD-10-CM | POA: Diagnosis not present

## 2021-03-11 DIAGNOSIS — K760 Fatty (change of) liver, not elsewhere classified: Secondary | ICD-10-CM | POA: Diagnosis not present

## 2021-04-05 ENCOUNTER — Ambulatory Visit
Admission: RE | Admit: 2021-04-05 | Discharge: 2021-04-05 | Disposition: A | Discharge status: Home / Self Care | Payer: Medicare Other | Source: Ambulatory Visit | Attending: Internal Medicine | Admitting: Internal Medicine

## 2021-04-05 ENCOUNTER — Other Ambulatory Visit: Payer: Self-pay

## 2021-04-05 DIAGNOSIS — Z1231 Encounter for screening mammogram for malignant neoplasm of breast: Secondary | ICD-10-CM | POA: Diagnosis not present

## 2021-05-08 DIAGNOSIS — M9901 Segmental and somatic dysfunction of cervical region: Secondary | ICD-10-CM | POA: Diagnosis not present

## 2021-05-08 DIAGNOSIS — M5415 Radiculopathy, thoracolumbar region: Secondary | ICD-10-CM | POA: Diagnosis not present

## 2021-05-08 DIAGNOSIS — M9903 Segmental and somatic dysfunction of lumbar region: Secondary | ICD-10-CM | POA: Diagnosis not present

## 2021-05-08 DIAGNOSIS — M531 Cervicobrachial syndrome: Secondary | ICD-10-CM | POA: Diagnosis not present

## 2021-05-08 DIAGNOSIS — M9902 Segmental and somatic dysfunction of thoracic region: Secondary | ICD-10-CM | POA: Diagnosis not present

## 2021-05-08 DIAGNOSIS — M5136 Other intervertebral disc degeneration, lumbar region: Secondary | ICD-10-CM | POA: Diagnosis not present

## 2021-05-10 DIAGNOSIS — M9902 Segmental and somatic dysfunction of thoracic region: Secondary | ICD-10-CM | POA: Diagnosis not present

## 2021-05-10 DIAGNOSIS — M5136 Other intervertebral disc degeneration, lumbar region: Secondary | ICD-10-CM | POA: Diagnosis not present

## 2021-05-10 DIAGNOSIS — M9903 Segmental and somatic dysfunction of lumbar region: Secondary | ICD-10-CM | POA: Diagnosis not present

## 2021-05-10 DIAGNOSIS — M9901 Segmental and somatic dysfunction of cervical region: Secondary | ICD-10-CM | POA: Diagnosis not present

## 2021-05-10 DIAGNOSIS — M531 Cervicobrachial syndrome: Secondary | ICD-10-CM | POA: Diagnosis not present

## 2021-05-10 DIAGNOSIS — M5415 Radiculopathy, thoracolumbar region: Secondary | ICD-10-CM | POA: Diagnosis not present

## 2021-05-13 DIAGNOSIS — M9901 Segmental and somatic dysfunction of cervical region: Secondary | ICD-10-CM | POA: Diagnosis not present

## 2021-05-13 DIAGNOSIS — M9903 Segmental and somatic dysfunction of lumbar region: Secondary | ICD-10-CM | POA: Diagnosis not present

## 2021-05-13 DIAGNOSIS — M531 Cervicobrachial syndrome: Secondary | ICD-10-CM | POA: Diagnosis not present

## 2021-05-13 DIAGNOSIS — M5136 Other intervertebral disc degeneration, lumbar region: Secondary | ICD-10-CM | POA: Diagnosis not present

## 2021-05-13 DIAGNOSIS — M5415 Radiculopathy, thoracolumbar region: Secondary | ICD-10-CM | POA: Diagnosis not present

## 2021-05-13 DIAGNOSIS — M9902 Segmental and somatic dysfunction of thoracic region: Secondary | ICD-10-CM | POA: Diagnosis not present

## 2021-05-16 DIAGNOSIS — M5415 Radiculopathy, thoracolumbar region: Secondary | ICD-10-CM | POA: Diagnosis not present

## 2021-05-16 DIAGNOSIS — M531 Cervicobrachial syndrome: Secondary | ICD-10-CM | POA: Diagnosis not present

## 2021-05-16 DIAGNOSIS — M9902 Segmental and somatic dysfunction of thoracic region: Secondary | ICD-10-CM | POA: Diagnosis not present

## 2021-05-16 DIAGNOSIS — M9901 Segmental and somatic dysfunction of cervical region: Secondary | ICD-10-CM | POA: Diagnosis not present

## 2021-05-16 DIAGNOSIS — M5136 Other intervertebral disc degeneration, lumbar region: Secondary | ICD-10-CM | POA: Diagnosis not present

## 2021-05-16 DIAGNOSIS — M9903 Segmental and somatic dysfunction of lumbar region: Secondary | ICD-10-CM | POA: Diagnosis not present

## 2021-05-22 DIAGNOSIS — M5136 Other intervertebral disc degeneration, lumbar region: Secondary | ICD-10-CM | POA: Diagnosis not present

## 2021-05-22 DIAGNOSIS — M531 Cervicobrachial syndrome: Secondary | ICD-10-CM | POA: Diagnosis not present

## 2021-05-22 DIAGNOSIS — M9901 Segmental and somatic dysfunction of cervical region: Secondary | ICD-10-CM | POA: Diagnosis not present

## 2021-05-22 DIAGNOSIS — M9902 Segmental and somatic dysfunction of thoracic region: Secondary | ICD-10-CM | POA: Diagnosis not present

## 2021-05-22 DIAGNOSIS — M9903 Segmental and somatic dysfunction of lumbar region: Secondary | ICD-10-CM | POA: Diagnosis not present

## 2021-05-22 DIAGNOSIS — M5415 Radiculopathy, thoracolumbar region: Secondary | ICD-10-CM | POA: Diagnosis not present

## 2021-05-29 DIAGNOSIS — M9902 Segmental and somatic dysfunction of thoracic region: Secondary | ICD-10-CM | POA: Diagnosis not present

## 2021-05-29 DIAGNOSIS — M9901 Segmental and somatic dysfunction of cervical region: Secondary | ICD-10-CM | POA: Diagnosis not present

## 2021-05-29 DIAGNOSIS — M5415 Radiculopathy, thoracolumbar region: Secondary | ICD-10-CM | POA: Diagnosis not present

## 2021-05-29 DIAGNOSIS — M531 Cervicobrachial syndrome: Secondary | ICD-10-CM | POA: Diagnosis not present

## 2021-05-29 DIAGNOSIS — M9903 Segmental and somatic dysfunction of lumbar region: Secondary | ICD-10-CM | POA: Diagnosis not present

## 2021-05-29 DIAGNOSIS — M5136 Other intervertebral disc degeneration, lumbar region: Secondary | ICD-10-CM | POA: Diagnosis not present

## 2021-06-05 DIAGNOSIS — M9901 Segmental and somatic dysfunction of cervical region: Secondary | ICD-10-CM | POA: Diagnosis not present

## 2021-06-05 DIAGNOSIS — M5136 Other intervertebral disc degeneration, lumbar region: Secondary | ICD-10-CM | POA: Diagnosis not present

## 2021-06-05 DIAGNOSIS — M5415 Radiculopathy, thoracolumbar region: Secondary | ICD-10-CM | POA: Diagnosis not present

## 2021-06-05 DIAGNOSIS — M531 Cervicobrachial syndrome: Secondary | ICD-10-CM | POA: Diagnosis not present

## 2021-06-05 DIAGNOSIS — M9902 Segmental and somatic dysfunction of thoracic region: Secondary | ICD-10-CM | POA: Diagnosis not present

## 2021-06-05 DIAGNOSIS — M9903 Segmental and somatic dysfunction of lumbar region: Secondary | ICD-10-CM | POA: Diagnosis not present

## 2021-06-19 DIAGNOSIS — M9903 Segmental and somatic dysfunction of lumbar region: Secondary | ICD-10-CM | POA: Diagnosis not present

## 2021-06-19 DIAGNOSIS — M9901 Segmental and somatic dysfunction of cervical region: Secondary | ICD-10-CM | POA: Diagnosis not present

## 2021-06-19 DIAGNOSIS — M531 Cervicobrachial syndrome: Secondary | ICD-10-CM | POA: Diagnosis not present

## 2021-06-19 DIAGNOSIS — M5415 Radiculopathy, thoracolumbar region: Secondary | ICD-10-CM | POA: Diagnosis not present

## 2021-06-19 DIAGNOSIS — M5136 Other intervertebral disc degeneration, lumbar region: Secondary | ICD-10-CM | POA: Diagnosis not present

## 2021-06-19 DIAGNOSIS — M9902 Segmental and somatic dysfunction of thoracic region: Secondary | ICD-10-CM | POA: Diagnosis not present

## 2021-12-07 DIAGNOSIS — M9902 Segmental and somatic dysfunction of thoracic region: Secondary | ICD-10-CM | POA: Diagnosis not present

## 2021-12-07 DIAGNOSIS — M9901 Segmental and somatic dysfunction of cervical region: Secondary | ICD-10-CM | POA: Diagnosis not present

## 2021-12-07 DIAGNOSIS — M5414 Radiculopathy, thoracic region: Secondary | ICD-10-CM | POA: Diagnosis not present

## 2021-12-07 DIAGNOSIS — M9903 Segmental and somatic dysfunction of lumbar region: Secondary | ICD-10-CM | POA: Diagnosis not present

## 2021-12-07 DIAGNOSIS — M5432 Sciatica, left side: Secondary | ICD-10-CM | POA: Diagnosis not present

## 2021-12-07 DIAGNOSIS — M531 Cervicobrachial syndrome: Secondary | ICD-10-CM | POA: Diagnosis not present

## 2021-12-09 DIAGNOSIS — M5432 Sciatica, left side: Secondary | ICD-10-CM | POA: Diagnosis not present

## 2021-12-09 DIAGNOSIS — M531 Cervicobrachial syndrome: Secondary | ICD-10-CM | POA: Diagnosis not present

## 2021-12-09 DIAGNOSIS — M9901 Segmental and somatic dysfunction of cervical region: Secondary | ICD-10-CM | POA: Diagnosis not present

## 2021-12-09 DIAGNOSIS — M9903 Segmental and somatic dysfunction of lumbar region: Secondary | ICD-10-CM | POA: Diagnosis not present

## 2021-12-09 DIAGNOSIS — M9902 Segmental and somatic dysfunction of thoracic region: Secondary | ICD-10-CM | POA: Diagnosis not present

## 2021-12-09 DIAGNOSIS — M5414 Radiculopathy, thoracic region: Secondary | ICD-10-CM | POA: Diagnosis not present

## 2021-12-12 DIAGNOSIS — M9903 Segmental and somatic dysfunction of lumbar region: Secondary | ICD-10-CM | POA: Diagnosis not present

## 2021-12-12 DIAGNOSIS — M5432 Sciatica, left side: Secondary | ICD-10-CM | POA: Diagnosis not present

## 2021-12-12 DIAGNOSIS — M9901 Segmental and somatic dysfunction of cervical region: Secondary | ICD-10-CM | POA: Diagnosis not present

## 2021-12-12 DIAGNOSIS — M531 Cervicobrachial syndrome: Secondary | ICD-10-CM | POA: Diagnosis not present

## 2021-12-12 DIAGNOSIS — M9902 Segmental and somatic dysfunction of thoracic region: Secondary | ICD-10-CM | POA: Diagnosis not present

## 2021-12-12 DIAGNOSIS — M5414 Radiculopathy, thoracic region: Secondary | ICD-10-CM | POA: Diagnosis not present

## 2021-12-16 DIAGNOSIS — M9903 Segmental and somatic dysfunction of lumbar region: Secondary | ICD-10-CM | POA: Diagnosis not present

## 2021-12-16 DIAGNOSIS — M5414 Radiculopathy, thoracic region: Secondary | ICD-10-CM | POA: Diagnosis not present

## 2021-12-16 DIAGNOSIS — M5432 Sciatica, left side: Secondary | ICD-10-CM | POA: Diagnosis not present

## 2021-12-16 DIAGNOSIS — M531 Cervicobrachial syndrome: Secondary | ICD-10-CM | POA: Diagnosis not present

## 2021-12-16 DIAGNOSIS — M9902 Segmental and somatic dysfunction of thoracic region: Secondary | ICD-10-CM | POA: Diagnosis not present

## 2021-12-16 DIAGNOSIS — M9901 Segmental and somatic dysfunction of cervical region: Secondary | ICD-10-CM | POA: Diagnosis not present

## 2021-12-19 DIAGNOSIS — M9902 Segmental and somatic dysfunction of thoracic region: Secondary | ICD-10-CM | POA: Diagnosis not present

## 2021-12-19 DIAGNOSIS — M5432 Sciatica, left side: Secondary | ICD-10-CM | POA: Diagnosis not present

## 2021-12-19 DIAGNOSIS — M9903 Segmental and somatic dysfunction of lumbar region: Secondary | ICD-10-CM | POA: Diagnosis not present

## 2021-12-19 DIAGNOSIS — M531 Cervicobrachial syndrome: Secondary | ICD-10-CM | POA: Diagnosis not present

## 2021-12-19 DIAGNOSIS — M9901 Segmental and somatic dysfunction of cervical region: Secondary | ICD-10-CM | POA: Diagnosis not present

## 2021-12-19 DIAGNOSIS — M5414 Radiculopathy, thoracic region: Secondary | ICD-10-CM | POA: Diagnosis not present

## 2021-12-23 DIAGNOSIS — M9903 Segmental and somatic dysfunction of lumbar region: Secondary | ICD-10-CM | POA: Diagnosis not present

## 2021-12-23 DIAGNOSIS — M9901 Segmental and somatic dysfunction of cervical region: Secondary | ICD-10-CM | POA: Diagnosis not present

## 2021-12-23 DIAGNOSIS — M531 Cervicobrachial syndrome: Secondary | ICD-10-CM | POA: Diagnosis not present

## 2021-12-23 DIAGNOSIS — M9902 Segmental and somatic dysfunction of thoracic region: Secondary | ICD-10-CM | POA: Diagnosis not present

## 2021-12-23 DIAGNOSIS — M5432 Sciatica, left side: Secondary | ICD-10-CM | POA: Diagnosis not present

## 2021-12-23 DIAGNOSIS — M5414 Radiculopathy, thoracic region: Secondary | ICD-10-CM | POA: Diagnosis not present

## 2021-12-26 DIAGNOSIS — M9901 Segmental and somatic dysfunction of cervical region: Secondary | ICD-10-CM | POA: Diagnosis not present

## 2021-12-26 DIAGNOSIS — M9903 Segmental and somatic dysfunction of lumbar region: Secondary | ICD-10-CM | POA: Diagnosis not present

## 2021-12-26 DIAGNOSIS — M9902 Segmental and somatic dysfunction of thoracic region: Secondary | ICD-10-CM | POA: Diagnosis not present

## 2021-12-26 DIAGNOSIS — M5414 Radiculopathy, thoracic region: Secondary | ICD-10-CM | POA: Diagnosis not present

## 2021-12-26 DIAGNOSIS — M5432 Sciatica, left side: Secondary | ICD-10-CM | POA: Diagnosis not present

## 2021-12-26 DIAGNOSIS — M531 Cervicobrachial syndrome: Secondary | ICD-10-CM | POA: Diagnosis not present

## 2021-12-30 DIAGNOSIS — M5432 Sciatica, left side: Secondary | ICD-10-CM | POA: Diagnosis not present

## 2021-12-30 DIAGNOSIS — M9902 Segmental and somatic dysfunction of thoracic region: Secondary | ICD-10-CM | POA: Diagnosis not present

## 2021-12-30 DIAGNOSIS — M531 Cervicobrachial syndrome: Secondary | ICD-10-CM | POA: Diagnosis not present

## 2021-12-30 DIAGNOSIS — M5414 Radiculopathy, thoracic region: Secondary | ICD-10-CM | POA: Diagnosis not present

## 2021-12-30 DIAGNOSIS — M9903 Segmental and somatic dysfunction of lumbar region: Secondary | ICD-10-CM | POA: Diagnosis not present

## 2021-12-30 DIAGNOSIS — M9901 Segmental and somatic dysfunction of cervical region: Secondary | ICD-10-CM | POA: Diagnosis not present

## 2022-01-06 DIAGNOSIS — M5414 Radiculopathy, thoracic region: Secondary | ICD-10-CM | POA: Diagnosis not present

## 2022-01-06 DIAGNOSIS — M9903 Segmental and somatic dysfunction of lumbar region: Secondary | ICD-10-CM | POA: Diagnosis not present

## 2022-01-06 DIAGNOSIS — M9902 Segmental and somatic dysfunction of thoracic region: Secondary | ICD-10-CM | POA: Diagnosis not present

## 2022-01-06 DIAGNOSIS — M5432 Sciatica, left side: Secondary | ICD-10-CM | POA: Diagnosis not present

## 2022-01-06 DIAGNOSIS — M531 Cervicobrachial syndrome: Secondary | ICD-10-CM | POA: Diagnosis not present

## 2022-01-06 DIAGNOSIS — M9901 Segmental and somatic dysfunction of cervical region: Secondary | ICD-10-CM | POA: Diagnosis not present

## 2022-01-20 DIAGNOSIS — M9903 Segmental and somatic dysfunction of lumbar region: Secondary | ICD-10-CM | POA: Diagnosis not present

## 2022-01-20 DIAGNOSIS — M9901 Segmental and somatic dysfunction of cervical region: Secondary | ICD-10-CM | POA: Diagnosis not present

## 2022-01-20 DIAGNOSIS — M531 Cervicobrachial syndrome: Secondary | ICD-10-CM | POA: Diagnosis not present

## 2022-01-20 DIAGNOSIS — M5414 Radiculopathy, thoracic region: Secondary | ICD-10-CM | POA: Diagnosis not present

## 2022-01-20 DIAGNOSIS — M9902 Segmental and somatic dysfunction of thoracic region: Secondary | ICD-10-CM | POA: Diagnosis not present

## 2022-01-20 DIAGNOSIS — M5432 Sciatica, left side: Secondary | ICD-10-CM | POA: Diagnosis not present

## 2022-02-03 DIAGNOSIS — M9901 Segmental and somatic dysfunction of cervical region: Secondary | ICD-10-CM | POA: Diagnosis not present

## 2022-02-03 DIAGNOSIS — M5432 Sciatica, left side: Secondary | ICD-10-CM | POA: Diagnosis not present

## 2022-02-03 DIAGNOSIS — M9902 Segmental and somatic dysfunction of thoracic region: Secondary | ICD-10-CM | POA: Diagnosis not present

## 2022-02-03 DIAGNOSIS — M9903 Segmental and somatic dysfunction of lumbar region: Secondary | ICD-10-CM | POA: Diagnosis not present

## 2022-02-03 DIAGNOSIS — M5414 Radiculopathy, thoracic region: Secondary | ICD-10-CM | POA: Diagnosis not present

## 2022-02-03 DIAGNOSIS — M531 Cervicobrachial syndrome: Secondary | ICD-10-CM | POA: Diagnosis not present

## 2022-02-27 DIAGNOSIS — M5432 Sciatica, left side: Secondary | ICD-10-CM | POA: Diagnosis not present

## 2022-02-27 DIAGNOSIS — M9903 Segmental and somatic dysfunction of lumbar region: Secondary | ICD-10-CM | POA: Diagnosis not present

## 2022-02-27 DIAGNOSIS — M531 Cervicobrachial syndrome: Secondary | ICD-10-CM | POA: Diagnosis not present

## 2022-02-27 DIAGNOSIS — M9902 Segmental and somatic dysfunction of thoracic region: Secondary | ICD-10-CM | POA: Diagnosis not present

## 2022-02-27 DIAGNOSIS — M9901 Segmental and somatic dysfunction of cervical region: Secondary | ICD-10-CM | POA: Diagnosis not present

## 2022-02-27 DIAGNOSIS — M5414 Radiculopathy, thoracic region: Secondary | ICD-10-CM | POA: Diagnosis not present

## 2022-06-03 DIAGNOSIS — M5137 Other intervertebral disc degeneration, lumbosacral region: Secondary | ICD-10-CM | POA: Diagnosis not present

## 2022-06-03 DIAGNOSIS — M9903 Segmental and somatic dysfunction of lumbar region: Secondary | ICD-10-CM | POA: Diagnosis not present

## 2022-06-03 DIAGNOSIS — M9902 Segmental and somatic dysfunction of thoracic region: Secondary | ICD-10-CM | POA: Diagnosis not present

## 2022-06-03 DIAGNOSIS — M9901 Segmental and somatic dysfunction of cervical region: Secondary | ICD-10-CM | POA: Diagnosis not present

## 2022-06-03 DIAGNOSIS — M531 Cervicobrachial syndrome: Secondary | ICD-10-CM | POA: Diagnosis not present

## 2022-06-10 DIAGNOSIS — M531 Cervicobrachial syndrome: Secondary | ICD-10-CM | POA: Diagnosis not present

## 2022-06-10 DIAGNOSIS — M9902 Segmental and somatic dysfunction of thoracic region: Secondary | ICD-10-CM | POA: Diagnosis not present

## 2022-06-10 DIAGNOSIS — M5137 Other intervertebral disc degeneration, lumbosacral region: Secondary | ICD-10-CM | POA: Diagnosis not present

## 2022-06-10 DIAGNOSIS — M9901 Segmental and somatic dysfunction of cervical region: Secondary | ICD-10-CM | POA: Diagnosis not present

## 2022-06-10 DIAGNOSIS — M9903 Segmental and somatic dysfunction of lumbar region: Secondary | ICD-10-CM | POA: Diagnosis not present

## 2022-06-12 DIAGNOSIS — M9901 Segmental and somatic dysfunction of cervical region: Secondary | ICD-10-CM | POA: Diagnosis not present

## 2022-06-12 DIAGNOSIS — M5137 Other intervertebral disc degeneration, lumbosacral region: Secondary | ICD-10-CM | POA: Diagnosis not present

## 2022-06-12 DIAGNOSIS — M9902 Segmental and somatic dysfunction of thoracic region: Secondary | ICD-10-CM | POA: Diagnosis not present

## 2022-06-12 DIAGNOSIS — M531 Cervicobrachial syndrome: Secondary | ICD-10-CM | POA: Diagnosis not present

## 2022-06-12 DIAGNOSIS — M9903 Segmental and somatic dysfunction of lumbar region: Secondary | ICD-10-CM | POA: Diagnosis not present

## 2022-06-16 DIAGNOSIS — M9901 Segmental and somatic dysfunction of cervical region: Secondary | ICD-10-CM | POA: Diagnosis not present

## 2022-06-16 DIAGNOSIS — M531 Cervicobrachial syndrome: Secondary | ICD-10-CM | POA: Diagnosis not present

## 2022-06-16 DIAGNOSIS — M5137 Other intervertebral disc degeneration, lumbosacral region: Secondary | ICD-10-CM | POA: Diagnosis not present

## 2022-06-16 DIAGNOSIS — M9903 Segmental and somatic dysfunction of lumbar region: Secondary | ICD-10-CM | POA: Diagnosis not present

## 2022-06-16 DIAGNOSIS — M9902 Segmental and somatic dysfunction of thoracic region: Secondary | ICD-10-CM | POA: Diagnosis not present

## 2022-06-19 DIAGNOSIS — M531 Cervicobrachial syndrome: Secondary | ICD-10-CM | POA: Diagnosis not present

## 2022-06-19 DIAGNOSIS — M9903 Segmental and somatic dysfunction of lumbar region: Secondary | ICD-10-CM | POA: Diagnosis not present

## 2022-06-19 DIAGNOSIS — M9901 Segmental and somatic dysfunction of cervical region: Secondary | ICD-10-CM | POA: Diagnosis not present

## 2022-06-19 DIAGNOSIS — M9902 Segmental and somatic dysfunction of thoracic region: Secondary | ICD-10-CM | POA: Diagnosis not present

## 2022-06-19 DIAGNOSIS — M5137 Other intervertebral disc degeneration, lumbosacral region: Secondary | ICD-10-CM | POA: Diagnosis not present

## 2022-06-25 DIAGNOSIS — M9903 Segmental and somatic dysfunction of lumbar region: Secondary | ICD-10-CM | POA: Diagnosis not present

## 2022-06-25 DIAGNOSIS — M9902 Segmental and somatic dysfunction of thoracic region: Secondary | ICD-10-CM | POA: Diagnosis not present

## 2022-06-25 DIAGNOSIS — M5137 Other intervertebral disc degeneration, lumbosacral region: Secondary | ICD-10-CM | POA: Diagnosis not present

## 2022-06-25 DIAGNOSIS — M531 Cervicobrachial syndrome: Secondary | ICD-10-CM | POA: Diagnosis not present

## 2022-06-25 DIAGNOSIS — M9901 Segmental and somatic dysfunction of cervical region: Secondary | ICD-10-CM | POA: Diagnosis not present

## 2022-07-07 DIAGNOSIS — M531 Cervicobrachial syndrome: Secondary | ICD-10-CM | POA: Diagnosis not present

## 2022-07-07 DIAGNOSIS — M9902 Segmental and somatic dysfunction of thoracic region: Secondary | ICD-10-CM | POA: Diagnosis not present

## 2022-07-07 DIAGNOSIS — M5137 Other intervertebral disc degeneration, lumbosacral region: Secondary | ICD-10-CM | POA: Diagnosis not present

## 2022-07-07 DIAGNOSIS — M9901 Segmental and somatic dysfunction of cervical region: Secondary | ICD-10-CM | POA: Diagnosis not present

## 2022-07-07 DIAGNOSIS — M9903 Segmental and somatic dysfunction of lumbar region: Secondary | ICD-10-CM | POA: Diagnosis not present

## 2022-07-10 DIAGNOSIS — M5137 Other intervertebral disc degeneration, lumbosacral region: Secondary | ICD-10-CM | POA: Diagnosis not present

## 2022-07-10 DIAGNOSIS — M9903 Segmental and somatic dysfunction of lumbar region: Secondary | ICD-10-CM | POA: Diagnosis not present

## 2022-07-10 DIAGNOSIS — M531 Cervicobrachial syndrome: Secondary | ICD-10-CM | POA: Diagnosis not present

## 2022-07-10 DIAGNOSIS — M9901 Segmental and somatic dysfunction of cervical region: Secondary | ICD-10-CM | POA: Diagnosis not present

## 2022-07-10 DIAGNOSIS — M9902 Segmental and somatic dysfunction of thoracic region: Secondary | ICD-10-CM | POA: Diagnosis not present

## 2022-07-15 DIAGNOSIS — M9902 Segmental and somatic dysfunction of thoracic region: Secondary | ICD-10-CM | POA: Diagnosis not present

## 2022-07-15 DIAGNOSIS — M531 Cervicobrachial syndrome: Secondary | ICD-10-CM | POA: Diagnosis not present

## 2022-07-15 DIAGNOSIS — M9903 Segmental and somatic dysfunction of lumbar region: Secondary | ICD-10-CM | POA: Diagnosis not present

## 2022-07-15 DIAGNOSIS — M5137 Other intervertebral disc degeneration, lumbosacral region: Secondary | ICD-10-CM | POA: Diagnosis not present

## 2022-07-15 DIAGNOSIS — M9901 Segmental and somatic dysfunction of cervical region: Secondary | ICD-10-CM | POA: Diagnosis not present

## 2022-07-22 DIAGNOSIS — M5137 Other intervertebral disc degeneration, lumbosacral region: Secondary | ICD-10-CM | POA: Diagnosis not present

## 2022-07-22 DIAGNOSIS — M9902 Segmental and somatic dysfunction of thoracic region: Secondary | ICD-10-CM | POA: Diagnosis not present

## 2022-07-22 DIAGNOSIS — M531 Cervicobrachial syndrome: Secondary | ICD-10-CM | POA: Diagnosis not present

## 2022-07-22 DIAGNOSIS — M9901 Segmental and somatic dysfunction of cervical region: Secondary | ICD-10-CM | POA: Diagnosis not present

## 2022-07-22 DIAGNOSIS — M9903 Segmental and somatic dysfunction of lumbar region: Secondary | ICD-10-CM | POA: Diagnosis not present

## 2022-07-23 DIAGNOSIS — M810 Age-related osteoporosis without current pathological fracture: Secondary | ICD-10-CM | POA: Diagnosis not present

## 2022-07-23 DIAGNOSIS — Z0000 Encounter for general adult medical examination without abnormal findings: Secondary | ICD-10-CM | POA: Diagnosis not present

## 2022-07-23 DIAGNOSIS — I6529 Occlusion and stenosis of unspecified carotid artery: Secondary | ICD-10-CM | POA: Diagnosis not present

## 2022-07-23 DIAGNOSIS — K219 Gastro-esophageal reflux disease without esophagitis: Secondary | ICD-10-CM | POA: Diagnosis not present

## 2022-07-23 DIAGNOSIS — M545 Low back pain, unspecified: Secondary | ICD-10-CM | POA: Diagnosis not present

## 2022-07-23 DIAGNOSIS — E559 Vitamin D deficiency, unspecified: Secondary | ICD-10-CM | POA: Diagnosis not present

## 2022-07-23 DIAGNOSIS — I6523 Occlusion and stenosis of bilateral carotid arteries: Secondary | ICD-10-CM | POA: Diagnosis not present

## 2022-07-23 DIAGNOSIS — D473 Essential (hemorrhagic) thrombocythemia: Secondary | ICD-10-CM | POA: Diagnosis not present

## 2022-07-23 DIAGNOSIS — K760 Fatty (change of) liver, not elsewhere classified: Secondary | ICD-10-CM | POA: Diagnosis not present

## 2022-07-23 DIAGNOSIS — Z91038 Other insect allergy status: Secondary | ICD-10-CM | POA: Diagnosis not present

## 2022-07-23 DIAGNOSIS — E785 Hyperlipidemia, unspecified: Secondary | ICD-10-CM | POA: Diagnosis not present

## 2022-07-24 DIAGNOSIS — M9903 Segmental and somatic dysfunction of lumbar region: Secondary | ICD-10-CM | POA: Diagnosis not present

## 2022-07-24 DIAGNOSIS — M9901 Segmental and somatic dysfunction of cervical region: Secondary | ICD-10-CM | POA: Diagnosis not present

## 2022-07-24 DIAGNOSIS — M5137 Other intervertebral disc degeneration, lumbosacral region: Secondary | ICD-10-CM | POA: Diagnosis not present

## 2022-07-24 DIAGNOSIS — M9902 Segmental and somatic dysfunction of thoracic region: Secondary | ICD-10-CM | POA: Diagnosis not present

## 2022-07-24 DIAGNOSIS — M531 Cervicobrachial syndrome: Secondary | ICD-10-CM | POA: Diagnosis not present

## 2022-07-28 DIAGNOSIS — M9902 Segmental and somatic dysfunction of thoracic region: Secondary | ICD-10-CM | POA: Diagnosis not present

## 2022-07-28 DIAGNOSIS — M9903 Segmental and somatic dysfunction of lumbar region: Secondary | ICD-10-CM | POA: Diagnosis not present

## 2022-07-28 DIAGNOSIS — M5137 Other intervertebral disc degeneration, lumbosacral region: Secondary | ICD-10-CM | POA: Diagnosis not present

## 2022-07-28 DIAGNOSIS — M531 Cervicobrachial syndrome: Secondary | ICD-10-CM | POA: Diagnosis not present

## 2022-07-28 DIAGNOSIS — M9901 Segmental and somatic dysfunction of cervical region: Secondary | ICD-10-CM | POA: Diagnosis not present

## 2022-07-31 DIAGNOSIS — M5137 Other intervertebral disc degeneration, lumbosacral region: Secondary | ICD-10-CM | POA: Diagnosis not present

## 2022-07-31 DIAGNOSIS — M531 Cervicobrachial syndrome: Secondary | ICD-10-CM | POA: Diagnosis not present

## 2022-07-31 DIAGNOSIS — M9902 Segmental and somatic dysfunction of thoracic region: Secondary | ICD-10-CM | POA: Diagnosis not present

## 2022-07-31 DIAGNOSIS — M9903 Segmental and somatic dysfunction of lumbar region: Secondary | ICD-10-CM | POA: Diagnosis not present

## 2022-07-31 DIAGNOSIS — M9901 Segmental and somatic dysfunction of cervical region: Secondary | ICD-10-CM | POA: Diagnosis not present

## 2022-08-04 DIAGNOSIS — M5137 Other intervertebral disc degeneration, lumbosacral region: Secondary | ICD-10-CM | POA: Diagnosis not present

## 2022-08-04 DIAGNOSIS — M9901 Segmental and somatic dysfunction of cervical region: Secondary | ICD-10-CM | POA: Diagnosis not present

## 2022-08-04 DIAGNOSIS — M9902 Segmental and somatic dysfunction of thoracic region: Secondary | ICD-10-CM | POA: Diagnosis not present

## 2022-08-04 DIAGNOSIS — M531 Cervicobrachial syndrome: Secondary | ICD-10-CM | POA: Diagnosis not present

## 2022-08-04 DIAGNOSIS — M9903 Segmental and somatic dysfunction of lumbar region: Secondary | ICD-10-CM | POA: Diagnosis not present

## 2022-12-25 ENCOUNTER — Other Ambulatory Visit: Payer: Self-pay | Admitting: Internal Medicine

## 2022-12-25 DIAGNOSIS — Z1231 Encounter for screening mammogram for malignant neoplasm of breast: Secondary | ICD-10-CM

## 2022-12-30 ENCOUNTER — Ambulatory Visit
Admission: RE | Admit: 2022-12-30 | Discharge: 2022-12-30 | Disposition: A | Discharge status: Home / Self Care | Payer: Medicare Other | Source: Ambulatory Visit | Attending: Internal Medicine | Admitting: Internal Medicine

## 2022-12-30 DIAGNOSIS — Z1231 Encounter for screening mammogram for malignant neoplasm of breast: Secondary | ICD-10-CM

## 2023-01-18 IMAGING — MG MM DIGITAL SCREENING BILAT W/ TOMO AND CAD
8 series · 8 of 24 positions shown · non-contrast
Comparison: Previous exam(s).

ACR Breast Density Category a: The breast tissue is almost entirely
fatty.

CLINICAL DATA: Screening.

EXAM:
DIGITAL SCREENING BILATERAL MAMMOGRAM WITH TOMOSYNTHESIS AND CAD
TECHNIQUE: Bilateral screening digital craniocaudal and mediolateral oblique
mammograms were obtained. Bilateral screening digital breast
tomosynthesis was performed. The images were evaluated with
computer-aided detection.

[L CC synth-2D]
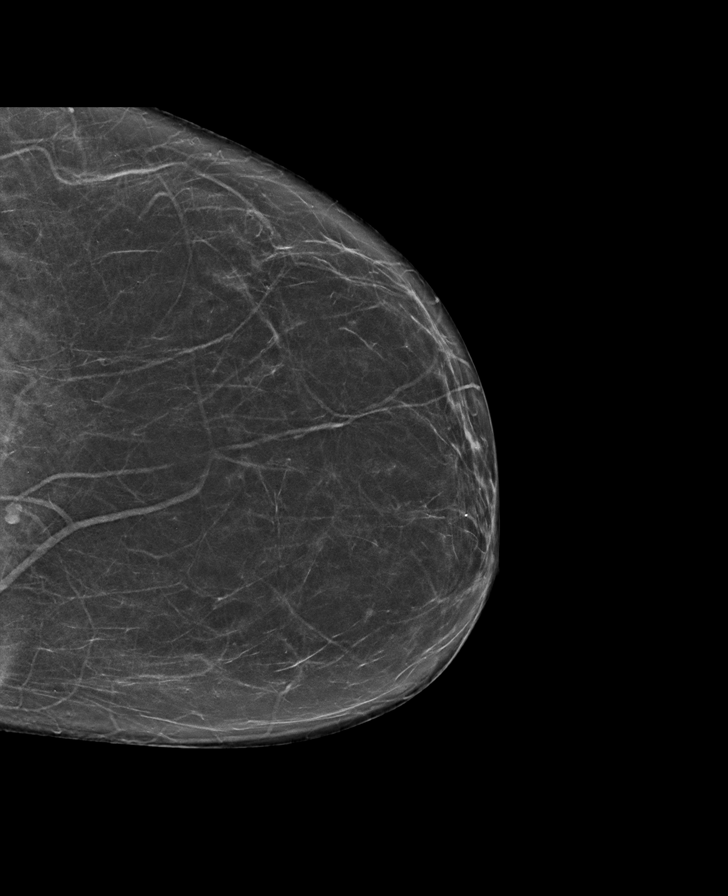

[R CC synth-2D]
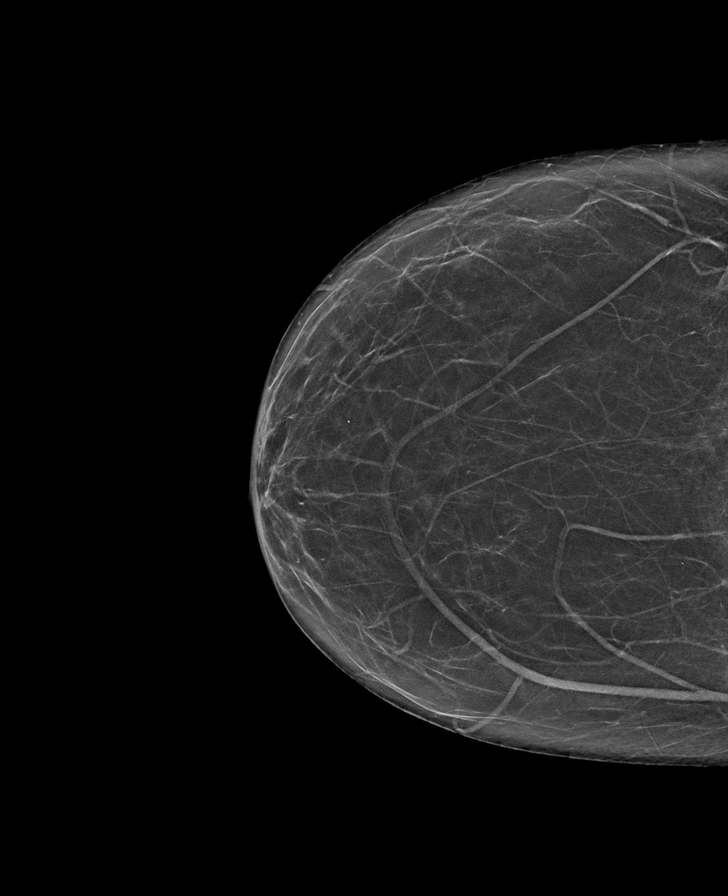

[R MLO synth-2D]
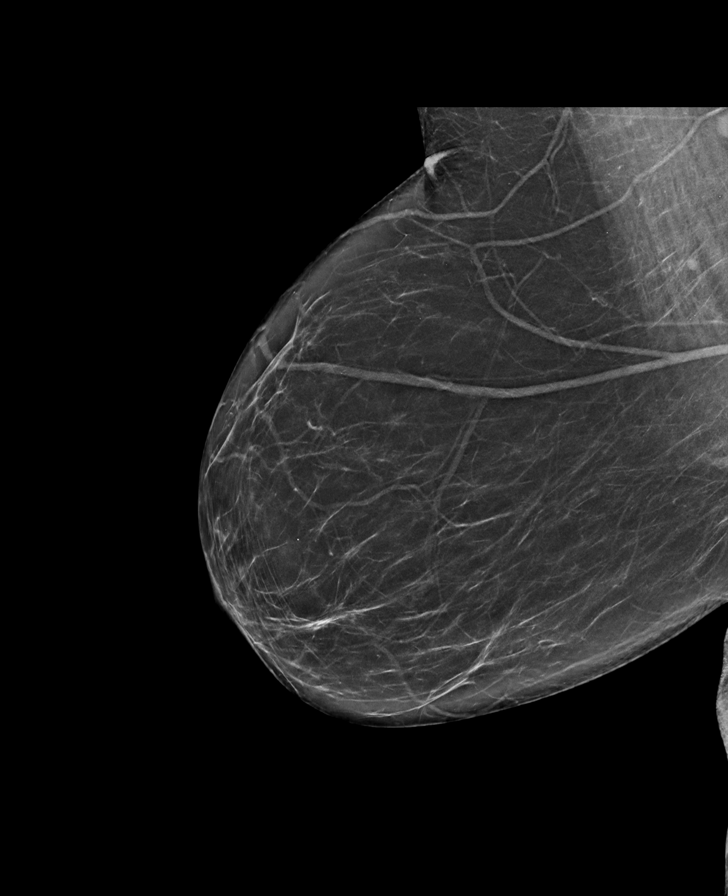

[L MLO synth-2D]
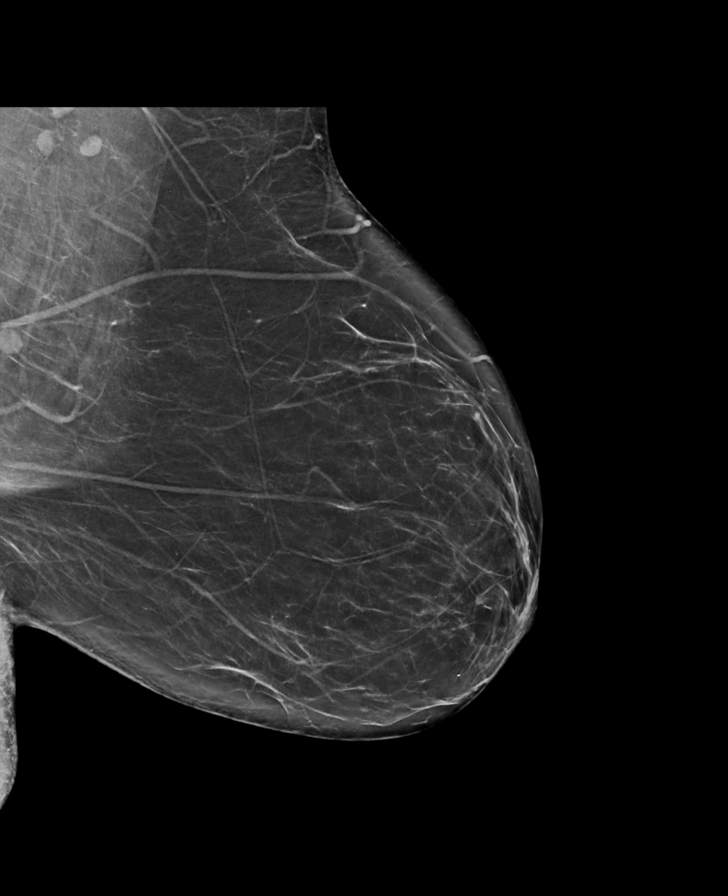

[R MLO tomo · tomo slice 37/73.0]
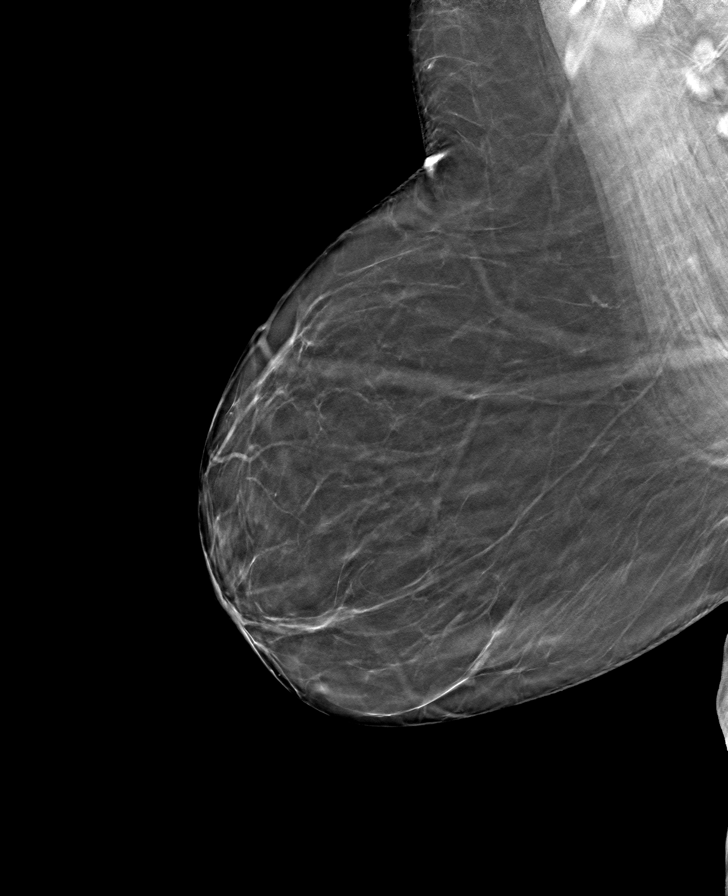

[L CC tomo · tomo slice 33/66.0]
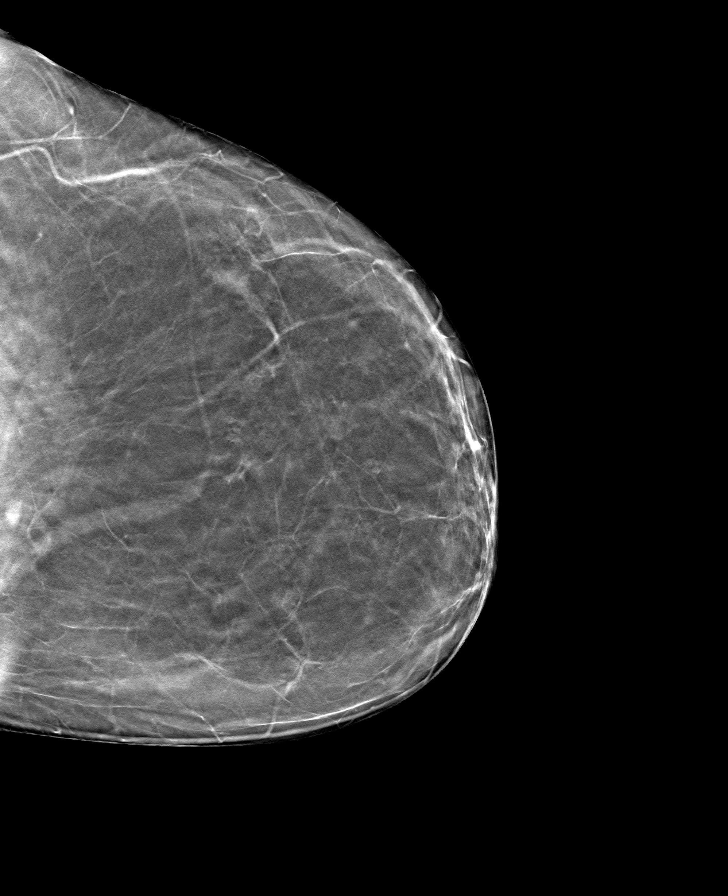

[L MLO tomo · tomo slice 39/78.0]
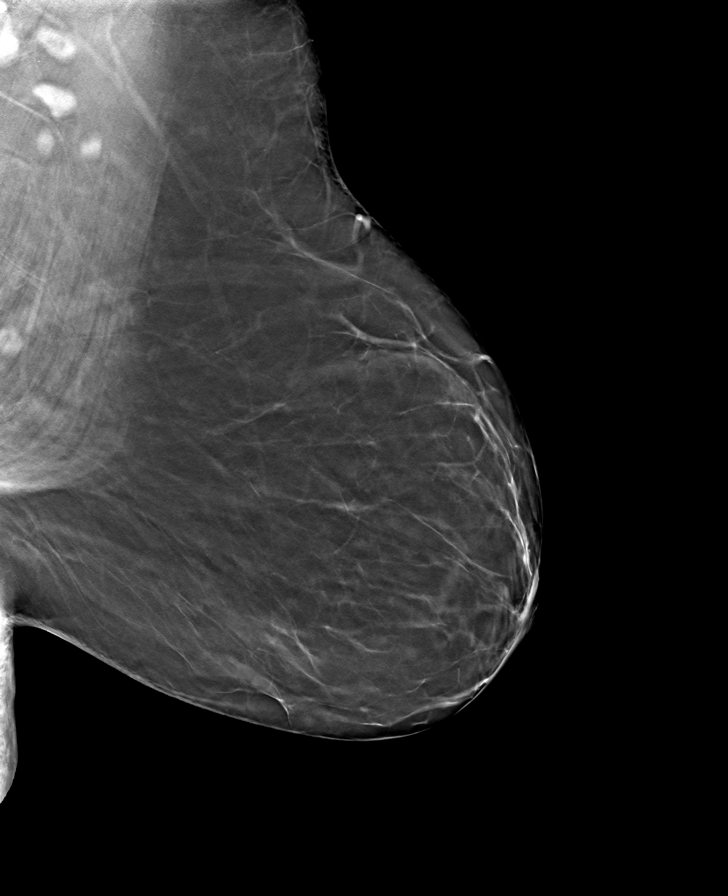

[R CC tomo · tomo slice 33/66.0]
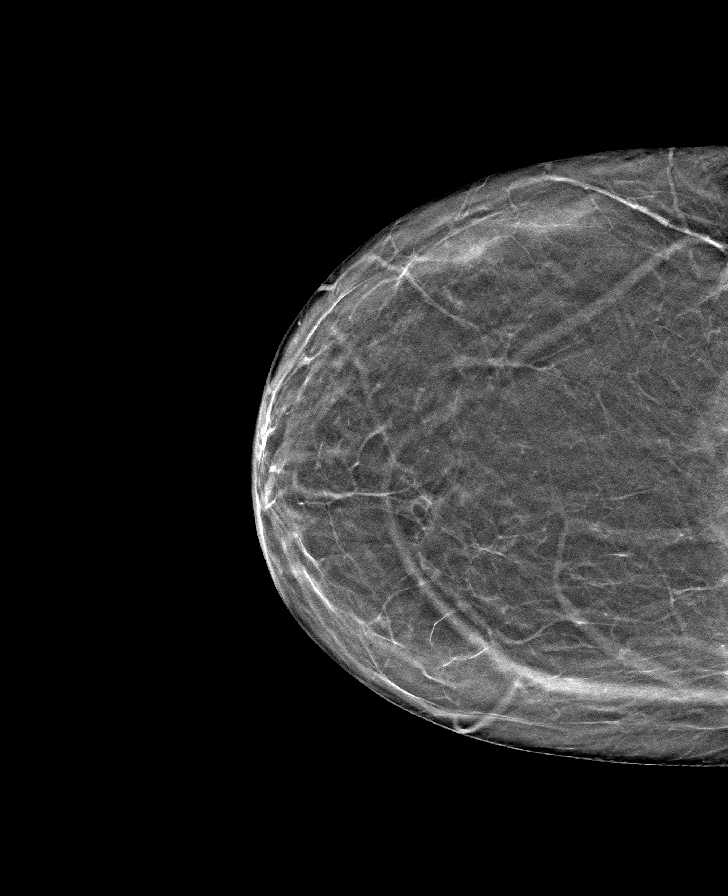

[8 of 24 positions shown; findings below may reference images not displayed]

FINDINGS: There are no findings suspicious for malignancy.
IMPRESSION: No mammographic evidence of malignancy. A result letter of this
screening mammogram will be mailed directly to the patient.

RECOMMENDATION:
Screening mammogram in one year. (Code:0E-3-N98)

BI-RADS CATEGORY  1: Negative.

## 2023-01-21 DIAGNOSIS — E785 Hyperlipidemia, unspecified: Secondary | ICD-10-CM | POA: Diagnosis not present

## 2023-04-01 DIAGNOSIS — F3162 Bipolar disorder, current episode mixed, moderate: Secondary | ICD-10-CM | POA: Insufficient documentation

## 2023-04-01 DIAGNOSIS — F411 Generalized anxiety disorder: Secondary | ICD-10-CM | POA: Insufficient documentation

## 2023-04-26 DIAGNOSIS — S20212A Contusion of left front wall of thorax, initial encounter: Secondary | ICD-10-CM | POA: Diagnosis not present

## 2023-04-29 ENCOUNTER — Ambulatory Visit
Admission: RE | Admit: 2023-04-29 | Discharge: 2023-04-29 | Disposition: A | Discharge status: Home / Self Care | Payer: Medicare Other | Source: Ambulatory Visit | Attending: Internal Medicine | Admitting: Internal Medicine

## 2023-04-29 ENCOUNTER — Other Ambulatory Visit: Payer: Self-pay | Admitting: Internal Medicine

## 2023-04-29 DIAGNOSIS — R0781 Pleurodynia: Secondary | ICD-10-CM | POA: Diagnosis not present

## 2023-04-29 DIAGNOSIS — R202 Paresthesia of skin: Secondary | ICD-10-CM | POA: Diagnosis not present

## 2023-05-07 ENCOUNTER — Ambulatory Visit
Admission: RE | Admit: 2023-05-07 | Discharge: 2023-05-07 | Disposition: A | Discharge status: Home / Self Care | Payer: Medicare Other | Source: Ambulatory Visit | Attending: Internal Medicine | Admitting: Internal Medicine

## 2023-05-07 ENCOUNTER — Other Ambulatory Visit: Payer: Self-pay | Admitting: Internal Medicine

## 2023-05-07 DIAGNOSIS — R531 Weakness: Secondary | ICD-10-CM

## 2023-05-07 DIAGNOSIS — R0781 Pleurodynia: Secondary | ICD-10-CM | POA: Diagnosis not present

## 2023-05-07 DIAGNOSIS — M25562 Pain in left knee: Secondary | ICD-10-CM

## 2023-05-07 DIAGNOSIS — S0990XA Unspecified injury of head, initial encounter: Secondary | ICD-10-CM

## 2023-05-07 DIAGNOSIS — M79642 Pain in left hand: Secondary | ICD-10-CM | POA: Diagnosis not present

## 2023-05-07 DIAGNOSIS — M25462 Effusion, left knee: Secondary | ICD-10-CM | POA: Diagnosis not present

## 2023-05-07 DIAGNOSIS — R296 Repeated falls: Secondary | ICD-10-CM | POA: Diagnosis not present

## 2023-05-07 DIAGNOSIS — R0789 Other chest pain: Secondary | ICD-10-CM | POA: Diagnosis not present

## 2023-05-07 DIAGNOSIS — D649 Anemia, unspecified: Secondary | ICD-10-CM | POA: Diagnosis not present

## 2023-05-13 DIAGNOSIS — H2513 Age-related nuclear cataract, bilateral: Secondary | ICD-10-CM | POA: Diagnosis not present

## 2023-05-13 DIAGNOSIS — H40033 Anatomical narrow angle, bilateral: Secondary | ICD-10-CM | POA: Diagnosis not present

## 2023-05-27 ENCOUNTER — Encounter: Payer: Self-pay | Admitting: Family

## 2023-05-27 ENCOUNTER — Ambulatory Visit (INDEPENDENT_AMBULATORY_CARE_PROVIDER_SITE_OTHER): Payer: Medicare Other | Admitting: Family

## 2023-05-27 DIAGNOSIS — R200 Anesthesia of skin: Secondary | ICD-10-CM

## 2023-05-27 MED ORDER — PREDNISONE 50 MG PO TABS: ORAL_TABLET | ORAL | 0 refills | Status: AC

## 2023-05-27 NOTE — Progress Notes (Signed)
Office Visit Note   Patient: Anne Watson           Date of Birth: 1960/11/29           MRN: [id number] Visit Date: 05/27/2023              Requested by: Lorenda Ishihara, MD [address] [place],  [place] [address] PCP: Lorenda Ishihara, MD  Chief Complaint  Patient presents with   Right Hand - Numbness   Right Foot - Numbness      HPI: The patient is a 62 year old woman who presents today complaining of 2 separate issues   #1 right hand numbness tingling of the thumb index and long finger she has associated pain however she denies any pain to the wrist she does have associated weakness difficulty opening things denies dropping anything this has been ongoing for 3 months without any associated injury no associated neck pain  She is right-hand dominant  #2 she has been having issues with numbness in the right foot primarily along the dorsum of her foot and in the great toe this has been gradually worsening she denies any injuries no swelling no warmth no weakness no difficulty with weightbearing  She did has had 3 recent falls however she feels these are mechanical falls have not been related to issues with her right foot  Does have some chronic low back pain but feels unrelated never lumbar radiculopathy.   Assessment & Plan: Visit Diagnoses: No diagnosis found.  Plan: Will place her on a prednisone burst.  Suspicion for possible lumbar radiculopathy element.  Will also refer her for EMGs of the right upper extremity.  May have her follow-up with Dr. Fara Boros for the right upper extremity  Follow-Up Instructions: No follow-ups on file.   Back Exam   Tenderness  The patient is experiencing no tenderness.   Muscle Strength  The patient has normal back strength.  Tests  Straight leg raise right: negative Straight leg raise left: negative   Right Hand Exam   Tenderness  The patient is experiencing no tenderness.   Range of Motion   The patient has normal right wrist ROM.   Muscle Strength  Grip: 3/5   Tests  Phalen's Sign: negative Tinel's sign (median nerve): negative  Other  Erythema: absent Sensation: decreased Pulse: present      Patient is alert, oriented, no adenopathy, well-dressed, normal affect, normal respiratory effort. Numbness of the right thumb is worst at the finger nail plate.  Right foot no normothermic.  There is no ecchymosis no dystrophic . foot is plantigrade.  Neurovascularly intact  Imaging: No results found. No images are attached to the encounter.  Labs: Lab Results  Component Value Date   ESRSEDRATE 18 04/04/2014     No results found for: "ALBUMIN", "PREALBUMIN", "CBC"  No results found for: "MG" No results found for: "VD25OH"  No results found for: "PREALBUMIN"    Latest Ref Rng & Units 04/04/2014    3:21 PM  CBC EXTENDED  WBC 3.9 - 10.3 10e3/uL 5.2   RBC 3.70 - 5.45 10e6/uL 3.99   Hemoglobin 11.6 - 15.9 g/dL 64.4   HCT 03.4 - 74.2 % 35.1   Platelets 145 - 400 10e3/uL 411   NEUT# 1.5 - 6.5 10e3/uL 2.5   Lymph# 0.9 - 3.3 10e3/uL 2.1      There is no height or weight on file to calculate BMI.  Orders:  No orders of the defined  types were placed in this encounter.  Meds ordered this encounter  Medications   predniSONE (DELTASONE) 50 MG tablet    Sig: Take one tablet by mouth once daily for 5 days.    Dispense:  5 tablet    Refill:  0     Procedures: No procedures performed  Clinical Data: No additional findings.  ROS:  All other systems negative, except as noted in the HPI. Review of Systems  Objective: Vital Signs: There were no vitals taken for this visit.  Specialty Comments:  No specialty comments available.  PMFS History: Patient Active Problem List   Diagnosis Date Noted   Genetic testing 03/23/2020   Family history of breast cancer    Family history of colon cancer    Family history of melanoma    Family history of  pancreatic cancer    Family history of multiple myeloma    DJD (degenerative joint disease), lumbar 06/11/2014   Thrombocytosis 04/04/2014   KNEE PAIN, LEFT 08/25/2008   PATELLAR TENDINITIS 08/25/2008   Past Medical History:  Diagnosis Date   Allergic rhinitis    Chest pain    Depression    Family history of breast cancer    Family history of colon cancer    Family history of melanoma    Family history of multiple myeloma    Family history of pancreatic cancer    GERD (gastroesophageal reflux disease)    Hypercholesteremia    Thrombocytosis 04/04/2014   Trigeminal neuralgia     Family History  Problem Relation Age of Onset   Breast cancer Mother 19       recurrence at age 69   Stroke Father    Hypertension Father    Colon cancer Father 51       colon cancer   Breast cancer Sister 49   Multiple myeloma Maternal Grandmother 35   Pancreatic cancer Paternal Uncle        dx. in his 70s/80s; smoker   Heart Problems Paternal Grandfather    Heart Problems Paternal Uncle 80   COPD Paternal Uncle    Other Niece        negative BRCA testing ~20 years ago    Past Surgical History:  Procedure Laterality Date   BREAST BIOPSY Left    CESAREAN SECTION     CHOLECYSTECTOMY     CYST REMOVAL HAND Right    Social History   Occupational History   Occupation: Secretary/administrator: Market researcher   Occupation: BARTENDER    Employer: [employer]  Tobacco Use   Smoking status: Former    Current packs/day: 0.00    Average packs/day: 2.0 packs/day for 30.0 years (60.0 ttl pk-yrs)    Types: Cigarettes    Start date: 10/06/1974    Quit date: 10/06/2004    Years since quitting: 18.6   Smokeless tobacco: Never  Vaping Use   Vaping status: Never Used  Substance and Sexual Activity   Alcohol use: Yes    Comment: occasional   Drug use: No   Sexual activity: Not on file

## 2023-06-01 ENCOUNTER — Other Ambulatory Visit: Payer: Self-pay

## 2023-06-01 ENCOUNTER — Ambulatory Visit: Payer: Medicare Other | Attending: Internal Medicine

## 2023-06-01 DIAGNOSIS — M6281 Muscle weakness (generalized): Secondary | ICD-10-CM | POA: Insufficient documentation

## 2023-06-01 DIAGNOSIS — R2681 Unsteadiness on feet: Secondary | ICD-10-CM | POA: Insufficient documentation

## 2023-06-01 NOTE — Therapy (Signed)
OUTPATIENT PHYSICAL THERAPY NEURO EVALUATION   Patient Name: Anne Watson MRN: [id number] DOB:[birth year], 62 y.o., female Today's Date: 06/01/2023   PCP: Lorenda Ishihara, MD REFERRING PROVIDER: Lorenda Ishihara, MD  END OF SESSION:  PT End of Session - 06/01/23 1535     Visit Number 1    Authorization Type [employer]    PT Start Time 1535    PT Stop Time 1615    PT Time Calculation (min) 40 min             Past Medical History:  Diagnosis Date   Allergic rhinitis    Chest pain    Depression    Family history of breast cancer    Family history of colon cancer    Family history of melanoma    Family history of multiple myeloma    Family history of pancreatic cancer    GERD (gastroesophageal reflux disease)    Hypercholesteremia    Thrombocytosis 04/04/2014   Trigeminal neuralgia    Past Surgical History:  Procedure Laterality Date   BREAST BIOPSY Left    CESAREAN SECTION     CHOLECYSTECTOMY     CYST REMOVAL HAND Right    Patient Active Problem List   Diagnosis Date Noted   Genetic testing 03/23/2020   Family history of breast cancer    Family history of colon cancer    Family history of melanoma    Family history of pancreatic cancer    Family history of multiple myeloma    DJD (degenerative joint disease), lumbar 06/11/2014   Thrombocytosis 04/04/2014   KNEE PAIN, LEFT 08/25/2008   PATELLAR TENDINITIS 08/25/2008    ONSET DATE: past 6 months  REFERRING DIAG: R29.6 (ICD-10-CM) - Repeated falls  THERAPY DIAG:  No diagnosis found.  Rationale for Evaluation and Treatment: Rehabilitation  SUBJECTIVE:                                                                                                                                                                                             SUBJECTIVE STATEMENT: Repeated falls with 3 in ten days and additional fall with stepping up onto curb. Pt reports hx of concussion  several years ago when fell backwards and hit head but no symptoms lingering from this incident.  Notes she tends to list right when losing balance or in dark environs. Denies any otalgia, tinnitus, or vertigo.  Pt accompanied by: self  PERTINENT HISTORY:   PAIN:  Are you having pain? Yes: NPRS scale: 3/10 Pain location: anterior left knee Pain description: sore Aggravating factors: stairs Relieving factors: unknown  PRECAUTIONS: Fall  RED FLAGS: None   WEIGHT BEARING RESTRICTIONS: No  FALLS: Has patient fallen in last 6 months? Yes. Number of falls 6  LIVING ENVIRONMENT: Lives with: lives with their family Lives in: House/apartment Stairs: Yes: External: 5-6 steps; on left going up Has following equipment at home: None  PLOF: Independent  PATIENT GOALS:   OBJECTIVE:   DIAGNOSTIC FINDINGS:   COGNITION: Overall cognitive status: Within functional limits for tasks assessed   SENSATION: WFL  COORDINATION: WNL  EDEMA:  none  MUSCLE TONE: WNL     POSTURE: No Significant postural limitations  LOWER EXTREMITY ROM:     Active  Right Eval Left Eval  Hip flexion    Hip extension    Hip abduction    Hip adduction    Hip internal rotation    Hip external rotation    Knee flexion 120 120  Knee extension 0 0  Ankle dorsiflexion 18 18  Ankle plantarflexion 35 35  Ankle inversion    Ankle eversion     (Blank rows = not tested)  LOWER EXTREMITY MMT:    MMT Right Eval Left Eval  Hip flexion 5 5  Hip extension    Hip abduction 5 5  Hip adduction 5 5  Hip internal rotation    Hip external rotation    Knee flexion 5 5  Knee extension 5 5  Ankle dorsiflexion 3+ 5  Ankle plantarflexion 5 5  Ankle inversion    Ankle eversion    (Blank rows = not tested)  BED MOBILITY:  indep  TRANSFERS: indep   CURB:  Level of Assistance: Complete Independence Assistive device utilized: None Curb Comments:   STAIRS: Level of Assistance: Complete  Independence Stair Negotiation Technique: Alternating Pattern  with No Rails Number of Stairs: 12  Height of Stairs: 4-6"  Comments:   GAIT: Gait pattern: WFL Distance walked:  Assistive device utilized: None Level of assistance: Complete Independence Comments:   FUNCTIONAL TESTS:  5 times sit to stand: NT Berg Balance Scale: 56/56 Functional gait assessment: 29/30  M-CTSIB  Condition 1: Firm Surface, EO 30 Sec, Normal Sway  Condition 2: Firm Surface, EC 30 Sec, Mild Sway  Condition 3: Foam Surface, EO 30 Sec, Normal Sway  Condition 4: Foam Surface, EC 30 Sec, Moderate Sway       PATIENT EDUCATION: Education details: discussion of assessment details, fall risk measures/outcomes Person educated: Patient Education method: Explanation Education comprehension: verbalized understanding  HOME EXERCISE PROGRAM: Recommended active right dorsiflexion  GOALS: Goals reviewed with patient? N/A   ASSESSMENT:  CLINICAL IMPRESSION: Patient is a 62 y.o. lady who was seen today for physical therapy evaluation and treatment for repeated falls.  Berg Balance Test and Functional Gait Assessment performed revealing low risk for falls.  M-CTSIB reveals normal-mild sway except condition 4 of moderate sway.  Exhibits right tibialis anterior weakness of 3+/5 vs left 5/5.  Unsure as to nature of patient's LOB/falls but today's performance reveals low risk for falls and no focal deficits or limitations are obvious.  Pt notes right great toe numbness and exhibits weakness of dorsiflexion on this ankle as well.    OBJECTIVE IMPAIRMENTS: decreased strength. -right ankle dorsiflexion  ACTIVITY LIMITATIONS:  none  PARTICIPATION LIMITATIONS:  none  PERSONAL FACTORS: Time since onset of injury/illness/exacerbation are also affecting patient's functional outcome.   REHAB POTENTIAL: N/A  CLINICAL DECISION MAKING: Stable/uncomplicated  EVALUATION COMPLEXITY: Low  PLAN:  PT FREQUENCY: one time  visit  PT DURATION: N/A  PLANNED INTERVENTIONS: Therapeutic exercises, Therapeutic activity, Neuromuscular re-education, Balance training, Gait training, Patient/Family education, Self Care, and Joint mobilization--Evaluation only  PLAN FOR NEXT SESSION: N/A, evaluation only   4:29 PM, 06/01/23 M. Shary Decamp, PT, DPT Physical Therapist- [place] Office Number: [phone]

## 2023-06-02 ENCOUNTER — Ambulatory Visit: Payer: Medicare Other | Admitting: Physical Medicine and Rehabilitation

## 2023-06-02 DIAGNOSIS — R202 Paresthesia of skin: Secondary | ICD-10-CM

## 2023-06-02 NOTE — Progress Notes (Signed)
Functional Pain Scale - descriptive words and definitions  Mild (2)   Noticeable when not distracted/no impact on ADL's/sleep only slightly affected and able to   use both passive and active distraction for comfort. Mild range order  Average Pain 1  RUE NCS. Numbness in right hand, thumb and little finger. Has difficultly grasping.

## 2023-06-03 DIAGNOSIS — R200 Anesthesia of skin: Secondary | ICD-10-CM | POA: Diagnosis not present

## 2023-06-03 DIAGNOSIS — Z79899 Other long term (current) drug therapy: Secondary | ICD-10-CM | POA: Diagnosis not present

## 2023-06-03 DIAGNOSIS — R350 Frequency of micturition: Secondary | ICD-10-CM | POA: Diagnosis not present

## 2023-06-03 DIAGNOSIS — Z9181 History of falling: Secondary | ICD-10-CM | POA: Diagnosis not present

## 2023-06-03 DIAGNOSIS — N390 Urinary tract infection, site not specified: Secondary | ICD-10-CM | POA: Diagnosis not present

## 2023-06-03 DIAGNOSIS — I6529 Occlusion and stenosis of unspecified carotid artery: Secondary | ICD-10-CM | POA: Diagnosis not present

## 2023-06-03 DIAGNOSIS — Z131 Encounter for screening for diabetes mellitus: Secondary | ICD-10-CM | POA: Diagnosis not present

## 2023-06-03 DIAGNOSIS — E785 Hyperlipidemia, unspecified: Secondary | ICD-10-CM | POA: Diagnosis not present

## 2023-06-03 DIAGNOSIS — D649 Anemia, unspecified: Secondary | ICD-10-CM | POA: Diagnosis not present

## 2023-06-05 NOTE — Procedures (Unsigned)
EMG & NCV Findings: Evaluation of the right median motor nerve showed reduced amplitude (3.5 mV) and decreased conduction velocity (Elbow-Wrist, 48 m/s).  The right median (across palm) sensory nerve showed prolonged distal peak latency (Wrist, 3.9 ms) and prolonged distal peak latency (Palm, 2.1 ms).  All remaining nerves (as indicated in the following tables) were within normal limits.    All examined muscles (as indicated in the following table) showed no evidence of electrical instability.    Impression: The above electrodiagnostic study is ABNORMAL and reveals evidence of a moderate right median nerve entrapment at the wrist (carpal tunnel syndrome) affecting sensory and motor components. There is no significant electrodiagnostic evidence of any other focal nerve entrapment, brachial plexopathy or cervical radiculopathy.   Recommendations: 1.  Follow-up with referring physician. 2.  Continue current management of symptoms. 3.  Continue use of resting splint at night-time and as needed during the day.  ___________________________ Elease Hashimoto Board Certified, American Board of Physical Medicine and Rehabilitation    Nerve Conduction Studies Anti Sensory Summary Table   Stim Site NR Peak (ms) Norm Peak (ms) P-T Amp (V) Norm P-T Amp Site1 Site2 Delta-P (ms) Dist (cm) Vel (m/s) Norm Vel (m/s)  Right Median Acr Palm Anti Sensory (2nd Digit)  32.6C  Wrist    *3.9 <3.6 23.8 >10 Wrist Palm 1.8 0.0    Palm    *2.1 <2.0 19.1         Right Radial Anti Sensory (Base 1st Digit)  31C  Wrist    2.4 <3.1 25.2  Wrist Base 1st Digit 2.4 0.0    Right Ulnar Anti Sensory (5th Digit)  31.8C  Wrist    3.6 <3.7 23.9 >15.0 Wrist 5th Digit 3.6 14.0 39 >38   Motor Summary Table   Stim Site NR Onset (ms) Norm Onset (ms) O-P Amp (mV) Norm O-P Amp Site1 Site2 Delta-0 (ms) Dist (cm) Vel (m/s) Norm Vel (m/s)  Right Median Motor (Abd Poll Brev)  30.9C  Wrist    4.2 <4.2 *3.5 >5 Elbow Wrist 4.2 20.0  *48 >50  Elbow    8.4  2.6         Right Ulnar Motor (Abd Dig Min)  30.8C  Wrist    3.0 <4.2 10.2 >3 B Elbow Wrist 3.2 19.0 59 >53  B Elbow    6.2  9.7  A Elbow B Elbow 1.1 10.0 91 >53  A Elbow    7.3  9.6          EMG   Side Muscle Nerve Root Ins Act Fibs Psw Amp Dur Poly Recrt Int Dennie Bible Comment  Right Abd Poll Brev Median C8-T1 Nml Nml Nml Nml Nml 0 Nml Nml   Right 1stDorInt Ulnar C8-T1 Nml Nml Nml Nml Nml 0 Nml Nml   Right PronatorTeres Median C6-7 Nml Nml Nml Nml Nml 0 Nml Nml   Right Biceps Musculocut C5-6 Nml Nml Nml Nml Nml 0 Nml Nml   Right Deltoid Axillary C5-6 Nml Nml Nml Nml Nml 0 Nml Nml     Nerve Conduction Studies Anti Sensory Left/Right Comparison   Stim Site L Lat (ms) R Lat (ms) L-R Lat (ms) L Amp (V) R Amp (V) L-R Amp (%) Site1 Site2 L Vel (m/s) R Vel (m/s) L-R Vel (m/s)  Median Acr Palm Anti Sensory (2nd Digit)  32.6C  Wrist  *3.9   23.8  Wrist Palm     Palm  *2.1   19.1  Radial Anti Sensory (Base 1st Digit)  31C  Wrist  2.4   25.2  Wrist Base 1st Digit     Ulnar Anti Sensory (5th Digit)  31.8C  Wrist  3.6   23.9  Wrist 5th Digit  39    Motor Left/Right Comparison   Stim Site L Lat (ms) R Lat (ms) L-R Lat (ms) L Amp (mV) R Amp (mV) L-R Amp (%) Site1 Site2 L Vel (m/s) R Vel (m/s) L-R Vel (m/s)  Median Motor (Abd Poll Brev)  30.9C  Wrist  4.2   *3.5  Elbow Wrist  *48   Elbow  8.4   2.6        Ulnar Motor (Abd Dig Min)  30.8C  Wrist  3.0   10.2  B Elbow Wrist  59   B Elbow  6.2   9.7  A Elbow B Elbow  91   A Elbow  7.3   9.6           Waveforms:

## 2023-06-06 ENCOUNTER — Encounter: Payer: Self-pay | Admitting: Physical Medicine and Rehabilitation

## 2023-06-06 NOTE — Progress Notes (Signed)
Anne Watson - 62 y.o. female MRN [id number]  Date of birth: November 01, 1960  Office Visit Note: Visit Date: 06/02/2023 PCP: Lorenda Ishihara, MD Referred by: Adonis Huguenin, NP  Subjective: Chief Complaint  Patient presents with   Right Hand - Numbness   Right Thumb - Numbness   Right Little Finger - Numbness   HPI: Anne Watson is a 62 y.o. female who comes in today at the request of Barnie Del, FNP for evaluation and management of chronic, worsening and severe pain, numbness and tingling in the Right upper extremities.  Patient is Right hand dominant.  She reports chronic and worsening and severe paresthesias mostly in the radial digits but sometimes in the fifth digit.  Right now she is not having much in the way of pain today but it can be at times very problematic.  She reports recent pain scale really mild with no impact on ADLs just more annoying.  She does have some difficulty grasping objects and weakness.  She denies any frank radicular type symptoms.  No history of diabetes.  She does have a history of generalized anxiety disorder and bipolar disorder.   I spent more than 30 minutes speaking face-to-face with the patient with 50% of the time in counseling and discussing coordination of care.     Review of Systems  Musculoskeletal:  Positive for joint pain.  Neurological:  Positive for tingling and focal weakness.  All other systems reviewed and are negative.  Otherwise per HPI.  Assessment & Plan: Visit Diagnoses:    ICD-10-CM   1. Paresthesia of skin  R20.2 NCV with EMG (electromyography)       Plan: Impression: Clinically differential diagnosis would be median neuropathy at the wrist versus other neuropathy versus radiculopathy and musculoskeletal complaints.  Electrodiagnostic study performed today.  The above electrodiagnostic study is ABNORMAL and reveals evidence of a moderate right median nerve entrapment at the wrist (carpal tunnel syndrome)  affecting sensory and motor components. There is no significant electrodiagnostic evidence of any other focal nerve entrapment, brachial plexopathy or cervical radiculopathy.   Recommendations: 1.  Follow-up with referring physician. 2.  Continue current management of symptoms. 3.  Continue use of resting splint at night-time and as needed during the day.  Meds & Orders: No orders of the defined types were placed in this encounter.   Orders Placed This Encounter  Procedures   NCV with EMG (electromyography)    Follow-up: Return for  Barnie Del, FN-P.   Procedures: No procedures performed  EMG & NCV Findings: Evaluation of the right median motor nerve showed reduced amplitude (3.5 mV) and decreased conduction velocity (Elbow-Wrist, 48 m/s).  The right median (across palm) sensory nerve showed prolonged distal peak latency (Wrist, 3.9 ms) and prolonged distal peak latency (Palm, 2.1 ms).  All remaining nerves (as indicated in the following tables) were within normal limits.    All examined muscles (as indicated in the following table) showed no evidence of electrical instability.    Impression: The above electrodiagnostic study is ABNORMAL and reveals evidence of a moderate right median nerve entrapment at the wrist (carpal tunnel syndrome) affecting sensory and motor components. There is no significant electrodiagnostic evidence of any other focal nerve entrapment, brachial plexopathy or cervical radiculopathy.   Recommendations: 1.  Follow-up with referring physician. 2.  Continue current management of symptoms. 3.  Continue use of resting splint at night-time and as needed during the day.  ___________________________ Anne Watson Board Certified,  Biomedical engineer of Physical Medicine and Rehabilitation    Nerve Conduction Studies Anti Sensory Summary Table   Stim Site NR Peak (ms) Norm Peak (ms) P-T Amp (V) Norm P-T Amp Site1 Site2 Delta-P (ms) Dist (cm) Vel (m/s) Norm  Vel (m/s)  Right Median Acr Palm Anti Sensory (2nd Digit)  32.6C  Wrist    *3.9 <3.6 23.8 >10 Wrist Palm 1.8 0.0    Palm    *2.1 <2.0 19.1         Right Radial Anti Sensory (Base 1st Digit)  31C  Wrist    2.4 <3.1 25.2  Wrist Base 1st Digit 2.4 0.0    Right Ulnar Anti Sensory (5th Digit)  31.8C  Wrist    3.6 <3.7 23.9 >15.0 Wrist 5th Digit 3.6 14.0 39 >38   Motor Summary Table   Stim Site NR Onset (ms) Norm Onset (ms) O-P Amp (mV) Norm O-P Amp Site1 Site2 Delta-0 (ms) Dist (cm) Vel (m/s) Norm Vel (m/s)  Right Median Motor (Abd Poll Brev)  30.9C  Wrist    4.2 <4.2 *3.5 >5 Elbow Wrist 4.2 20.0 *48 >50  Elbow    8.4  2.6         Right Ulnar Motor (Abd Dig Min)  30.8C  Wrist    3.0 <4.2 10.2 >3 B Elbow Wrist 3.2 19.0 59 >53  B Elbow    6.2  9.7  A Elbow B Elbow 1.1 10.0 91 >53  A Elbow    7.3  9.6          EMG   Side Muscle Nerve Root Ins Act Fibs Psw Amp Dur Poly Recrt Int Dennie Bible Comment  Right Abd Poll Brev Median C8-T1 Nml Nml Nml Nml Nml 0 Nml Nml   Right 1stDorInt Ulnar C8-T1 Nml Nml Nml Nml Nml 0 Nml Nml   Right PronatorTeres Median C6-7 Nml Nml Nml Nml Nml 0 Nml Nml   Right Biceps Musculocut C5-6 Nml Nml Nml Nml Nml 0 Nml Nml   Right Deltoid Axillary C5-6 Nml Nml Nml Nml Nml 0 Nml Nml     Nerve Conduction Studies Anti Sensory Left/Right Comparison   Stim Site L Lat (ms) R Lat (ms) L-R Lat (ms) L Amp (V) R Amp (V) L-R Amp (%) Site1 Site2 L Vel (m/s) R Vel (m/s) L-R Vel (m/s)  Median Acr Palm Anti Sensory (2nd Digit)  32.6C  Wrist  *3.9   23.8  Wrist Palm     Palm  *2.1   19.1        Radial Anti Sensory (Base 1st Digit)  31C  Wrist  2.4   25.2  Wrist Base 1st Digit     Ulnar Anti Sensory (5th Digit)  31.8C  Wrist  3.6   23.9  Wrist 5th Digit  39    Motor Left/Right Comparison   Stim Site L Lat (ms) R Lat (ms) L-R Lat (ms) L Amp (mV) R Amp (mV) L-R Amp (%) Site1 Site2 L Vel (m/s) R Vel (m/s) L-R Vel (m/s)  Median Motor (Abd Poll Brev)  30.9C  Wrist  4.2   *3.5   Elbow Wrist  *48   Elbow  8.4   2.6        Ulnar Motor (Abd Dig Min)  30.8C  Wrist  3.0   10.2  B Elbow Wrist  59   B Elbow  6.2   9.7  A Elbow B Elbow  91   A Elbow  7.3   9.6           Waveforms:             Clinical History: No specialty comments available.   She reports that she quit smoking about 18 years ago. Her smoking use included cigarettes. She started smoking about 48 years ago. She has a 60 pack-year smoking history. She has never used smokeless tobacco. No results for input(s): "HGBA1C", "LABURIC" in the last 8760 hours.  Objective:  VS:  HT:    WT:   BMI:     BP:   HR: bpm  TEMP: ( )  RESP:  Physical Exam Vitals and nursing note reviewed.  Constitutional:      General: She is not in acute distress.    Appearance: Normal appearance. She is well-developed. She is not ill-appearing.  HENT:     Head: Normocephalic and atraumatic.  Eyes:     Conjunctiva/sclera: Conjunctivae normal.     Pupils: Pupils are equal, round, and reactive to light.  Cardiovascular:     Rate and Rhythm: Normal rate.     Pulses: Normal pulses.  Pulmonary:     Effort: Pulmonary effort is normal.  Musculoskeletal:        General: Tenderness present. No swelling or deformity.     Right lower leg: No edema.     Left lower leg: No edema.     Comments: Inspection reveals no atrophy of the bilateral APB or FDI or hand intrinsics. There is no swelling, color changes, allodynia or dystrophic changes. There is 5 out of 5 strength in the bilateral wrist extension, finger abduction and long finger flexion. There is intact sensation to light touch in all dermatomal and peripheral nerve distributions. There is a negative Phalen's test bilaterally. There is a negative Hoffmann's test bilaterally.  Skin:    General: Skin is warm and dry.     Findings: No erythema or rash.  Neurological:     General: No focal deficit present.     Mental Status: She is alert and oriented to person, place, and  time.     Cranial Nerves: No cranial nerve deficit.     Sensory: No sensory deficit.     Motor: No weakness or abnormal muscle tone.     Coordination: Coordination normal.     Gait: Gait normal.  Psychiatric:        Mood and Affect: Mood normal.        Behavior: Behavior normal.     Ortho Exam  Imaging: No results found.  Past Medical/Family/Surgical/Social History: Medications & Allergies reviewed per EMR, new medications updated. Patient Active Problem List   Diagnosis Date Noted   Generalized anxiety disorder 04/01/2023   Bipolar disorder, current episode mixed, moderate (HCC) 04/01/2023   Genetic testing 03/23/2020   Family history of breast cancer    Family history of colon cancer    Family history of melanoma    Family history of pancreatic cancer    Family history of multiple myeloma    Depression 01/31/2019   Allergic reaction 01/31/2019   DJD (degenerative joint disease), lumbar 06/11/2014   Thrombocytosis 04/04/2014   KNEE PAIN, LEFT 08/25/2008   PATELLAR TENDINITIS 08/25/2008   Past Medical History:  Diagnosis Date   Allergic rhinitis    Chest pain    Depression    Family history of breast cancer    Family history of colon cancer    Family history of melanoma  Family history of multiple myeloma    Family history of pancreatic cancer    GERD (gastroesophageal reflux disease)    Hypercholesteremia    Thrombocytosis 04/04/2014   Trigeminal neuralgia    Family History  Problem Relation Age of Onset   Breast cancer Mother 17       recurrence at age 47   Stroke Father    Hypertension Father    Colon cancer Father 74       colon cancer   Breast cancer Sister 3   Multiple myeloma Maternal Grandmother 40   Pancreatic cancer Paternal Uncle        dx. in his 70s/80s; smoker   Heart Problems Paternal Grandfather    Heart Problems Paternal Uncle 53   COPD Paternal Uncle    Other Niece        negative BRCA testing ~20 years ago   Past Surgical  History:  Procedure Laterality Date   BREAST BIOPSY Left    CESAREAN SECTION     CHOLECYSTECTOMY     CYST REMOVAL HAND Right    Social History   Occupational History   Occupation: Secretary/administrator: Market researcher   Occupation: BARTENDER    Employer: [employer]  Tobacco Use   Smoking status: Former    Current packs/day: 0.00    Average packs/day: 2.0 packs/day for 30.0 years (60.0 ttl pk-yrs)    Types: Cigarettes    Start date: 10/06/1974    Quit date: 10/06/2004    Years since quitting: 18.6   Smokeless tobacco: Never  Vaping Use   Vaping status: Never Used  Substance and Sexual Activity   Alcohol use: Yes    Comment: occasional   Drug use: No   Sexual activity: Not on file

## 2023-06-10 ENCOUNTER — Telehealth: Payer: Self-pay | Admitting: Family

## 2023-06-10 NOTE — Telephone Encounter (Signed)
FYI Called patient to schedule an appointment with Dr. Fara Boros for her right wrist. Patient said she will need to call me back after she get her calendar.

## 2023-08-11 DIAGNOSIS — N390 Urinary tract infection, site not specified: Secondary | ICD-10-CM | POA: Diagnosis not present

## 2023-11-07 DIAGNOSIS — U071 COVID-19: Secondary | ICD-10-CM | POA: Diagnosis not present

## 2023-12-30 DIAGNOSIS — L299 Pruritus, unspecified: Secondary | ICD-10-CM | POA: Diagnosis not present

## 2023-12-30 DIAGNOSIS — E785 Hyperlipidemia, unspecified: Secondary | ICD-10-CM | POA: Diagnosis not present

## 2023-12-30 DIAGNOSIS — M5431 Sciatica, right side: Secondary | ICD-10-CM | POA: Diagnosis not present

## 2023-12-30 DIAGNOSIS — M810 Age-related osteoporosis without current pathological fracture: Secondary | ICD-10-CM | POA: Diagnosis not present

## 2023-12-30 DIAGNOSIS — K219 Gastro-esophageal reflux disease without esophagitis: Secondary | ICD-10-CM | POA: Diagnosis not present

## 2023-12-30 DIAGNOSIS — Z0000 Encounter for general adult medical examination without abnormal findings: Secondary | ICD-10-CM | POA: Diagnosis not present

## 2023-12-30 DIAGNOSIS — Z23 Encounter for immunization: Secondary | ICD-10-CM | POA: Diagnosis not present

## 2023-12-31 ENCOUNTER — Other Ambulatory Visit: Payer: Self-pay | Admitting: Internal Medicine

## 2023-12-31 DIAGNOSIS — M810 Age-related osteoporosis without current pathological fracture: Secondary | ICD-10-CM

## 2024-01-01 ENCOUNTER — Other Ambulatory Visit: Payer: Self-pay | Admitting: Internal Medicine

## 2024-01-01 DIAGNOSIS — Z1231 Encounter for screening mammogram for malignant neoplasm of breast: Secondary | ICD-10-CM

## 2024-01-13 ENCOUNTER — Ambulatory Visit
Admission: RE | Admit: 2024-01-13 | Discharge: 2024-01-13 | Disposition: A | Discharge status: Home / Self Care | Source: Ambulatory Visit | Attending: Internal Medicine | Admitting: Internal Medicine

## 2024-01-13 DIAGNOSIS — Z1231 Encounter for screening mammogram for malignant neoplasm of breast: Secondary | ICD-10-CM

## 2024-02-18 DIAGNOSIS — L814 Other melanin hyperpigmentation: Secondary | ICD-10-CM | POA: Diagnosis not present

## 2024-02-18 DIAGNOSIS — L281 Prurigo nodularis: Secondary | ICD-10-CM | POA: Diagnosis not present

## 2024-02-18 DIAGNOSIS — R202 Paresthesia of skin: Secondary | ICD-10-CM | POA: Diagnosis not present

## 2024-02-18 DIAGNOSIS — L2089 Other atopic dermatitis: Secondary | ICD-10-CM | POA: Diagnosis not present

## 2024-02-18 DIAGNOSIS — L578 Other skin changes due to chronic exposure to nonionizing radiation: Secondary | ICD-10-CM | POA: Diagnosis not present

## 2024-02-18 DIAGNOSIS — D1801 Hemangioma of skin and subcutaneous tissue: Secondary | ICD-10-CM | POA: Diagnosis not present

## 2024-02-18 DIAGNOSIS — L821 Other seborrheic keratosis: Secondary | ICD-10-CM | POA: Diagnosis not present

## 2024-03-31 DIAGNOSIS — N390 Urinary tract infection, site not specified: Secondary | ICD-10-CM | POA: Diagnosis not present

## 2024-05-17 ENCOUNTER — Ambulatory Visit (HOSPITAL_BASED_OUTPATIENT_CLINIC_OR_DEPARTMENT_OTHER)
Admission: RE | Admit: 2024-05-17 | Discharge: 2024-05-17 | Disposition: A | Discharge status: Home / Self Care | Source: Ambulatory Visit | Attending: Internal Medicine | Admitting: Internal Medicine

## 2024-05-17 DIAGNOSIS — M810 Age-related osteoporosis without current pathological fracture: Secondary | ICD-10-CM | POA: Diagnosis not present

## 2024-05-17 DIAGNOSIS — Z780 Asymptomatic menopausal state: Secondary | ICD-10-CM | POA: Diagnosis not present

## 2024-05-17 DIAGNOSIS — M8589 Other specified disorders of bone density and structure, multiple sites: Secondary | ICD-10-CM | POA: Diagnosis not present

## 2024-06-06 DIAGNOSIS — R0781 Pleurodynia: Secondary | ICD-10-CM | POA: Diagnosis not present

## 2024-06-14 ENCOUNTER — Other Ambulatory Visit: Payer: Self-pay

## 2024-06-14 ENCOUNTER — Encounter: Payer: Self-pay | Admitting: Allergy & Immunology

## 2024-06-14 ENCOUNTER — Ambulatory Visit: Payer: Self-pay | Admitting: Allergy & Immunology

## 2024-06-14 VITALS — BP 118/84 | HR 67 | Temp 98.4°F | Resp 14 | Ht 63.25 in | Wt 187.4 lb

## 2024-06-14 DIAGNOSIS — L509 Urticaria, unspecified: Secondary | ICD-10-CM

## 2024-06-14 DIAGNOSIS — J310 Chronic rhinitis: Secondary | ICD-10-CM

## 2024-06-14 DIAGNOSIS — T63481D Toxic effect of venom of other arthropod, accidental (unintentional), subsequent encounter: Secondary | ICD-10-CM

## 2024-06-14 NOTE — Patient Instructions (Addendum)
 1. Urticaria - Your history does not have any red flags such as fevers, joint pains, or permanent skin changes that would be concerning for a more serious cause of hives.  - We will get some labs to rule out serious causes of hives: alpha gal panel, complete blood count, tryptase level, chronic urticaria panel, CMP, ESR, and CRP. - Chronic hives are often times a self limited process and will burn themselves out over 6-12 months, although this is not always the case.  - In the meantime, start suppressive dosing of antihistamines:   - Morning: Allegra (fexofenadine) 180mg   - Evening: Allegra (fexofenadine) 180mg  IF NEEDED  - If the above is not working, try adding: Pepcid (famotidine) 20mg  - You can change this dosing at home, decreasing the dose as needed or increasing the dosing as needed.  - If you are not tolerating the medications or are tired of taking them every day, we can start treatment with a monthly injectable medication called Xolair.    2. Chronic rhinitis - Because of insurance stipulations, we cannot do skin testing on the same day as your first visit. - We are all working to fight this, but for now we need to do two separate visits.  - We will know more after we do testing at the next visit.  - The skin testing visit can be squeezed in at your convenience.  - Then we can make a more full plan to address all of your symptoms. - Be sure to stop your antihistamines for 3 days before this appointment.   3. Return in about 1 week (around 06/21/2024) for SKIN TESTING (1-68). You can have the follow up appointment with Dr. Iva or a Nurse Practicioner (our Nurse Practitioners are excellent and always have Physician oversight!).    Please inform us  of any Emergency Department visits, hospitalizations, or changes in symptoms. Call us  before going to the ED for breathing or allergy  symptoms since we might be able to fit you in for a sick visit. Feel free to contact us  anytime with  any questions, problems, or concerns.  It was a pleasure to meet you today!  Websites that have reliable patient information: 1. American Academy of Asthma, Allergy , and Immunology: www.aaaai.org 2. Food Allergy  Research and Education (FARE): foodallergy.org 3. Mothers of Asthmatics: http://www.asthmacommunitynetwork.org 4. American College of Allergy , Asthma, and Immunology: www.acaai.org      "Like" us  on Facebook and Instagram for our latest updates!      A healthy democracy works best when [employer] participate! Make sure you are registered to vote! If you have moved or changed any of your contact information, you will need to get this updated before voting! Scan the QR codes below to learn more!

## 2024-06-14 NOTE — Progress Notes (Signed)
 NEW PATIENT  Date of Service/Encounter:  06/14/24  Consult requested by: Elliot Charm, MD   Assessment:   Urticaria  Chronic rhinitis  Insect sting allergy  - confirming with blood work today  Plan/Recommendations:   1. Urticaria - Your history does not have any red flags such as fevers, joint pains, or permanent skin changes that would be concerning for a more serious cause of hives.  - We will get some labs to rule out serious causes of hives: alpha gal panel, complete blood count, tryptase level, chronic urticaria panel, CMP, ESR, and CRP. - Chronic hives are often times a self limited process and will burn themselves out over 6-12 months, although this is not always the case.  - In the meantime, start suppressive dosing of antihistamines:   - Morning: Allegra (fexofenadine) 180mg   - Evening: Allegra (fexofenadine) 180mg  IF NEEDED  - If the above is not working, try adding: Pepcid (famotidine) 20mg  - You can change this dosing at home, decreasing the dose as needed or increasing the dosing as needed.  - If you are not tolerating the medications or are tired of taking them every day, we can start treatment with a monthly injectable medication called Xolair.    2. Chronic rhinitis - Because of insurance stipulations, we cannot do skin testing on the same day as your first visit. - We are all working to fight this, but for now we need to do two separate visits.  - We will know more after we do testing at the next visit.  - The skin testing visit can be squeezed in at your convenience.  - Then we can make a more full plan to address all of your symptoms. - Be sure to stop your antihistamines for 3 days before this appointment.   3. Return in about 1 week (around 06/21/2024) for SKIN TESTING (1-68). You can have the follow up appointment with Dr. Iva or a Nurse Practicioner (our Nurse Practitioners are excellent and always have Physician oversight!).    This  note in its entirety was forwarded to the Provider who requested this consultation.  Subjective:   Anne Watson is a 63 y.o. female presenting today for evaluation of  Chief Complaint  Patient presents with   Establish Care    She states when the season changed, she got the rash on chest, face, arms and legs. She would like to check allergy . She has history allergy  of peanut, tree nuts, almond.    Anne Watson has a history of the following: Patient Active Problem List   Diagnosis Date Noted   Generalized anxiety disorder 04/01/2023   Bipolar disorder, current episode mixed, moderate (HCC) 04/01/2023   Genetic testing 03/23/2020   Family history of breast cancer    Family history of colon cancer    Family history of melanoma    Family history of pancreatic cancer    Family history of multiple myeloma    Depression 01/31/2019   Allergic reaction 01/31/2019   DJD (degenerative joint disease), lumbar 06/11/2014   Thrombocytosis 04/04/2014   KNEE PAIN, LEFT 08/25/2008   PATELLAR TENDINITIS 08/25/2008    History obtained from: chart review and patient.  Discussed the use of AI scribe software for clinical note transcription with the patient and/or guardian, who gave verbal consent to proceed.  Anne Watson was referred by Elliot Charm, MD.     Anne Watson is a 63 y.o. female presenting for an evaluation of a rash and environmental  allergies.  She has been experiencing recurrent rashes since April, affecting various parts of her body including the arm, neck, leg, and face. The rashes are intensely pruritic, leading to excoriation and bleeding. She reports that the rashes seem to worsen with weather changes, and she suspects an environmental trigger.  She was previously evaluated by a dermatologist who prescribed triamcinolone  ointment, applied twice daily, but it did not provide relief, leading to discontinuation. She suspects the rashes may be related to her  known allergies, which include eastern oak, birch, peanuts, almonds, and possibly mango. Past allergy  testing was conducted years ago, and she received allergy  shots for a year but did not complete the full course. She currently takes Allegra once daily for allergies, initially for hives, but has not tried increasing the dose.  No history of autoimmune diseases like lupus or rheumatoid arthritis, but she has osteoarthritis. No lab work has been done specifically for the rashes or hives.  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 She currently cares for her 85 year old mother, who is in hospice care at home. Her mother has more lucid moments than not. She is generally doing well.     Otherwise, there is no history of other atopic diseases, including drug allergies, stinging insect allergies, or contact dermatitis. There is no significant infectious history. Vaccinations are up to date.    Past Medical History: Patient Active Problem List   Diagnosis Date Noted   Generalized anxiety disorder 04/01/2023   Bipolar disorder, current episode mixed, moderate (HCC) 04/01/2023   Genetic testing 03/23/2020   Family history of breast cancer    Family history of colon cancer    Family history of melanoma    Family history of pancreatic cancer    Family history of multiple myeloma    Depression 01/31/2019   Allergic reaction 01/31/2019   DJD (degenerative joint disease), lumbar 06/11/2014   Thrombocytosis 04/04/2014   KNEE PAIN, LEFT 08/25/2008   PATELLAR TENDINITIS 08/25/2008    Medication List:  Allergies as of 06/14/2024       Reactions   Almond Oil Anaphylaxis   Bee Venom Anaphylaxis   Peanut-containing Drug Products Anaphylaxis   Ceftin [cefuroxime Axetil] Diarrhea   Codeine Hives   Penicillins Hives   Tetracyclines & Related Hives         Medication List        Accurate as of June 14, 2024  2:59 PM. If you have any questions, ask your nurse or doctor.          Aplenzin 522 MG Tb24 Generic drug: BuPROPion HBr Take 522 mg by mouth daily.   aspirin EC 81 MG tablet Take 81 mg by mouth daily.   busPIRone 10 MG tablet Commonly known as: BUSPAR Take 10 mg by mouth 2 (two) times daily.   cholecalciferol 1000 units tablet Commonly known as: VITAMIN D Take 2,000 Units by mouth 2 (two) times daily.   Concerta 27 MG CR tablet Generic drug: methylphenidate 1 tablet in the morning Orally Once a day   cyclobenzaprine  5 MG tablet Commonly known as: FLEXERIL  Take 1 tablet (5 mg total) by mouth 3 (three) times daily as needed for muscle spasms.   diclofenac  sodium 1 % Gel Commonly known as: VOLTAREN  Apply 2 g topically 4 (four) times daily as needed.   EPINEPHrine 0.3 mg/0.3  mL Soaj injection Commonly known as: EPI-PEN See admin instructions.   fexofenadine 180 MG tablet Commonly known as: ALLEGRA Take 180 mg by mouth.   hydrOXYzine  25 MG tablet Commonly known as: ATARAX  Take 1 tablet (25 mg total) by mouth 3 (three) times daily as needed for itching.   ibuprofen 200 MG tablet Commonly known as: ADVIL Take 600 mg by mouth every 8 (eight) hours as needed for moderate pain.   meclizine  12.5 MG tablet Commonly known as: ANTIVERT  Take 1 tablet (12.5 mg total) by mouth 3 (three) times daily as needed for dizziness.   mupirocin  cream 2 % Commonly known as: Bactroban  Apply 1 application topically 2 (two) times daily.   omeprazole 10 MG capsule Commonly known as: PRILOSEC Take 10 mg by mouth daily.   ondansetron  4 MG disintegrating tablet Commonly known as: Zofran  ODT 4mg  ODT q4 hours prn nausea/vomit   pravastatin 20 MG tablet Commonly known as: PRAVACHOL Take 20 mg by mouth daily.   predniSONE  20 MG tablet Commonly known as: DELTASONE  Take 3 daily for 3 days, then 2 daily for 3 days,  then 1 daily for 3 days, then one half daily. Take after breakfast.   predniSONE  50 MG tablet Commonly known as: DELTASONE  Take one tablet by mouth once daily for 5 days.   traMADol  50 MG tablet Commonly known as: ULTRAM  TAKE 1 TABLET EVERY 8 HOURS AS NEEDED   traMADol  50 MG tablet Commonly known as: ULTRAM  1-2 TAB PO BID PRN   triamcinolone  cream 0.1 % Commonly known as: KENALOG  Apply 1 application topically 2 (two) times daily.        Birth History: non-contributory  Developmental History: non-contributory  Past Surgical History: Past Surgical History:  Procedure Laterality Date   BREAST BIOPSY Left    CESAREAN SECTION     CHOLECYSTECTOMY     CYST REMOVAL HAND Right      Family History: Family History  Problem Relation Age of Onset   Asthma Mother    Breast cancer Mother 36       recurrence at age 54   Asthma Father    Stroke Father    Hypertension Father    Colon cancer Father 15       colon cancer   Breast cancer Sister 74   Pancreatic cancer Paternal Uncle        dx. in his 70s/80s; smoker   Heart Problems Paternal Uncle 19   COPD Paternal Uncle    Multiple myeloma Maternal Grandmother 57   Heart Problems Paternal Grandfather    Asthma Daughter    Other Niece        negative BRCA testing ~20 years ago     Social History: Anne Watson lives at home with her mother, whom she is the primary caregiver for.  She lives in a house that is 65 years old.  There is carpeting throughout the home.  They have gas heating and central cooling.  There are 5 your keys in the home.  There are dust mite covers on the bed, but not the pillows.  There is no tobacco exposure.  She is currently retired and on disability.  There is no HEPA filter in the home.     Review of systems otherwise negative other than that mentioned in the HPI.    Objective:   Blood pressure 118/84, pulse 67, temperature 98.4 F (36.9 C), temperature source Temporal, resp. rate 14, height 5'  3.25 (1.607 m), weight 187 lb  6.4 oz (85 kg), SpO2 97%. Body mass index is 32.93 kg/m.     Physical Exam Vitals reviewed.  Constitutional:      Appearance: She is well-developed.     Comments: Smiling and pleasant. Cooperative with the exam. Friendly.   HENT:     Head: Normocephalic and atraumatic.     Right Ear: Tympanic membrane, ear canal and external ear normal. No drainage, swelling or tenderness. Tympanic membrane is not injected, scarred, erythematous, retracted or bulging.     Left Ear: Tympanic membrane, ear canal and external ear normal. No drainage, swelling or tenderness. Tympanic membrane is not injected, scarred, erythematous, retracted or bulging.     Nose: No nasal deformity, septal deviation, mucosal edema or rhinorrhea.     Right Turbinates: Enlarged, swollen and pale.     Left Turbinates: Enlarged, swollen and pale.     Right Sinus: No maxillary sinus tenderness or frontal sinus tenderness.     Left Sinus: No maxillary sinus tenderness or frontal sinus tenderness.     Mouth/Throat:     Mouth: Mucous membranes are not pale and not dry.     Pharynx: Uvula midline.  Eyes:     General: Lids are normal. Allergic shiner present.        Right eye: No discharge.        Left eye: No discharge.     Conjunctiva/sclera: Conjunctivae normal.     Right eye: Right conjunctiva is not injected. No chemosis.    Left eye: Left conjunctiva is not injected. No chemosis.    Pupils: Pupils are equal, round, and reactive to light.  Cardiovascular:     Rate and Rhythm: Normal rate and regular rhythm.     Heart sounds: Normal heart sounds.  Pulmonary:     Effort: Pulmonary effort is normal. No tachypnea, accessory muscle usage or respiratory distress.     Breath sounds: Normal breath sounds. No wheezing, rhonchi or rales.     Comments: Moving air well in all lung fields. No increased work of breathing noted.  Chest:     Chest wall: No tenderness.  Abdominal:     Tenderness: There  is no abdominal tenderness. There is no guarding or rebound.  Lymphadenopathy:     Head:     Right side of head: No submandibular, tonsillar or occipital adenopathy.     Left side of head: No submandibular, tonsillar or occipital adenopathy.     Cervical: No cervical adenopathy.  Skin:    General: Skin is warm.     Capillary Refill: Capillary refill takes less than 2 seconds.     Coloration: Skin is not pale.     Findings: No abrasion, erythema, petechiae or rash. Rash is not papular, urticarial or vesicular.  Neurological:     Mental Status: She is alert.  Psychiatric:        Behavior: Behavior is cooperative.      Diagnostic studies: none       Marty Shaggy, MD [facility] 

## 2024-06-15 LAB — THYROID CASCADE PROFILE: TSH: 3.3 u[IU]/mL (ref 0.450–4.500)

## 2024-06-17 LAB — HYMENOPTERA VENOM ALLERGY II

## 2024-06-18 LAB — CMP14+EGFR
ALT: 32 IU/L (ref 0–32)
AST: 41 IU/L — ABNORMAL HIGH (ref 0–40)
Albumin: 4.2 g/dL (ref 3.9–4.9)
Alkaline Phosphatase: 70 IU/L (ref 44–121)
BUN/Creatinine Ratio: 17 (ref 12–28)
BUN: 13 mg/dL (ref 8–27)
Bilirubin Total: 0.5 mg/dL (ref 0.0–1.2)
CO2: 23 mmol/L (ref 20–29)
Calcium: 10.4 mg/dL — ABNORMAL HIGH (ref 8.7–10.3)
Chloride: 101 mmol/L (ref 96–106)
Creatinine, Ser: 0.76 mg/dL (ref 0.57–1.00)
Globulin, Total: 2.8 g/dL (ref 1.5–4.5)
Glucose: 88 mg/dL (ref 70–99)
Potassium: 5.1 mmol/L (ref 3.5–5.2)
Sodium: 138 mmol/L (ref 134–144)
Total Protein: 7 g/dL (ref 6.0–8.5)
eGFR: 87 mL/min/1.73 (ref >59)

## 2024-06-18 LAB — ALPHA-GAL PANEL
Allergen Lamb IgE: <0.1 kU/L
Beef IgE: <0.1 kU/L
IgE (Immunoglobulin E), Serum: 23 [IU]/mL (ref 6–495)
O215-IgE Alpha-Gal: <0.1 kU/L
Pork IgE: <0.1 kU/L

## 2024-06-18 LAB — THYROID ANTIBODIES (THYROPEROXIDASE & THYROGLOBULIN)
Thyroglobulin Antibody: <1 [IU]/mL (ref 0.0–0.9)
Thyroperoxidase Ab SerPl-aCnc: 23 [IU]/mL (ref 0–34)

## 2024-06-18 LAB — FANA STAINING PATTERNS
Homogeneous Pattern: 1:320 {titer} — ABNORMAL HIGH
Speckled Pattern: 1:160 {titer} — ABNORMAL HIGH

## 2024-06-18 LAB — C-REACTIVE PROTEIN: CRP: 2 mg/L (ref 0–10)

## 2024-06-18 LAB — SEDIMENTATION RATE: Sed Rate: 18 mm/h (ref 0–40)

## 2024-06-18 LAB — CHRONIC URTICARIA PD-BAT: Pooled Donor- BAT CU: 3.2 (ref 0.00–10.60)

## 2024-06-18 LAB — ANTINUCLEAR ANTIBODIES, IFA: ANA Titer 1: POSITIVE — AB

## 2024-06-19 ENCOUNTER — Ambulatory Visit: Payer: Self-pay | Admitting: Allergy & Immunology

## 2024-06-19 DIAGNOSIS — R768 Other specified abnormal immunological findings in serum: Secondary | ICD-10-CM

## 2024-06-20 LAB — HYMENOPTERA VENOM ALLERGY II
Bumblebee: <0.1 kU/L
Hornet, White Face, IgE: 1.44 kU/L — AB
Hornet, Yellow, IgE: 1.02 kU/L — AB
I001-IgE Honeybee: 0.48 kU/L — AB
I003-IgE Yellow Jacket: 4.12 kU/L — AB
I004-IgE Paper Wasp: 5.21 kU/L — AB
I208-IgE Api m 1: <0.1 kU/L
I209-IgE Ves v 5: 4.22 kU/L — AB
I210-IgE Pol d 5: 7.97 kU/L — AB
I211-IgE Ves v 1: 0.14 kU/L — AB
I214-IgE Api m 2: <0.1 kU/L
I215-IgE Api m 3: <0.1 kU/L
I216-IgE Api m 5: 2.19 kU/L — AB
I217-IgE Api m 10: <0.1 kU/L
Tryptase: 8.7 ug/L (ref 2.2–13.2)

## 2024-06-20 LAB — ALLERGEN COMPONENT COMMENTS

## 2024-06-21 ENCOUNTER — Telehealth: Payer: Self-pay | Admitting: Allergy & Immunology

## 2024-06-21 NOTE — Telephone Encounter (Signed)
 Anne Watson is scheduled on 08/09/24 at 12:50 pm with Dr. Dale-Rheumatology

## 2024-06-23 ENCOUNTER — Ambulatory Visit: Admitting: Allergy & Immunology

## 2024-06-23 ENCOUNTER — Encounter: Payer: Self-pay | Admitting: Allergy & Immunology

## 2024-06-23 DIAGNOSIS — J3089 Other allergic rhinitis: Secondary | ICD-10-CM | POA: Diagnosis not present

## 2024-06-23 DIAGNOSIS — J302 Other seasonal allergic rhinitis: Secondary | ICD-10-CM

## 2024-06-23 DIAGNOSIS — L509 Urticaria, unspecified: Secondary | ICD-10-CM

## 2024-06-23 MED ORDER — FAMOTIDINE 20 MG PO TABS: 20.0000 mg | ORAL_TABLET | Freq: Two times a day (BID) | ORAL | 3 refills | Status: AC

## 2024-06-23 MED ORDER — EPINEPHRINE 0.3 MG/0.3ML IJ SOAJ: 0.3000 mg | Freq: Once | INTRAMUSCULAR | 2 refills | Status: AC

## 2024-06-23 NOTE — Patient Instructions (Addendum)
 1. Urticaria - Keep that appointment with Rheumatology. - In the meantime, continue suppressive dosing of antihistamines:   - Morning: Allegra (fexofenadine) 180mg   - Evening: Allegra (fexofenadine) 180mg  IF NEEDED  - If the above is not working, try adding: Pepcid  (famotidine ) 20mg  twice daily - You can change this dosing at home, decreasing the dose as needed or increasing the dosing as needed.  - If you are not tolerating the medications or are tired of taking them every day, we can start treatment with a monthly injectable medication called Xolair.    2. Chronic rhinitis - Testing today showed: indoor molds, outdoor molds, dog, and cockroach - Copy of test results provided.  - Avoidance measures provided. - Continue with: Allegra (fexofenadine) 180mg  tablet once daily - You can use an extra dose of the antihistamine, if needed, for breakthrough symptoms.  - Consider nasal saline rinses 1-2 times daily to remove allergens from the nasal cavities as well as help with mucous clearance (this is especially helpful to do before the nasal sprays are given)  3. Stinging insect allergy  - EpiPen  is up to date. - Consider venom immunotherapy.   4. Return in about 3 months (around 09/22/2024). You can have the follow up appointment with Dr. Iva or a Nurse Practicioner (our Nurse Practitioners are excellent and always have Physician oversight!).    Please inform us  of any Emergency Department visits, hospitalizations, or changes in symptoms. Call us  before going to the ED for breathing or allergy  symptoms since we might be able to fit you in for a sick visit. Feel free to contact us  anytime with any questions, problems, or concerns.  It was a pleasure to meet you today!  Websites that have reliable patient information: 1. American Academy of Asthma, Allergy , and Immunology: www.aaaai.org 2. Food Allergy  Research and Education (FARE): foodallergy.org 3. Mothers of Asthmatics:  http://www.asthmacommunitynetwork.org 4. [employer] of Allergy , Asthma, and Immunology: www.acaai.org      "Like" us  on Facebook and Instagram for our latest updates!      A healthy democracy works best when [employer] participate! Make sure you are registered to vote! If you have moved or changed any of your contact information, you will need to get this updated before voting! Scan the QR codes below to learn more!        Airborne Adult Perc - 06/23/24 0908     Time Antigen Placed 0908    Allergen Manufacturer Jestine    Location Back    Number of Test 55    1. Control-Buffer 50% Glycerol Negative    2. Control-Histamine 2+    3. Bahia Negative    4. French Southern Territories Negative    5. Johnson Negative    6. [place]  Blue Negative    7. Meadow Fescue Negative    8. Perennial Rye Negative    9. Timothy Negative    10. Ragweed Mix Negative    11. Cocklebur Negative    12. Plantain,  English Negative    13. Baccharis Negative    14. Dog Fennel Negative    15. Russian Thistle Negative    16. Lamb's Quarters Negative    17. Sheep Sorrell Negative    18. Rough Pigweed Negative    19. Marsh Elder, Rough Negative    20. Mugwort, Common Negative    21. Box, Elder Negative    22. Cedar, red Negative    23. Sweet Gum Negative    24. Pecan Pollen Negative  25. Pine Mix Negative    26. Walnut, Black Pollen Negative    27. Red Mulberry Negative    28. Ash Mix Negative    29. Birch Mix Negative    30. Beech American Negative    31. Cottonwood, Guinea-[place]issau Negative    32. Hickory, White Negative    33. Maple Mix Negative    34. Oak, [place] Mix Negative    35. Sycamore Eastern Negative    36. Alternaria Alternata Negative    37. Cladosporium Herbarum Negative    38. Aspergillus Mix Negative    39. Penicillium Mix Negative    40. Bipolaris Sorokiniana (Helminthosporium) Negative    41. Drechslera Spicifera (Curvularia) Negative    42. Mucor Plumbeus Negative    43. Fusarium  Moniliforme Negative    44. Aureobasidium Pullulans (pullulara) Negative    45. Rhizopus Oryzae Negative    46. Botrytis Cinera Negative    47. Epicoccum Nigrum Negative    48. Phoma Betae Negative    49. Dust Mite Mix Negative    50. Cat Hair 10,000 BAU/ml Negative    51.  Dog Epithelia Negative    52. Mixed Feathers Negative    53. Horse Epithelia Negative    54. Cockroach, German Negative    55. Tobacco Leaf Negative          13 Food Perc - 06/23/24 0908       Test Information   Time Antigen Placed 9090    Allergen Manufacturer Jestine    Location Back    Number of allergen test 13      Food   1. Peanut Negative    2. Soybean Negative    3. Wheat Negative    4. Sesame Negative    5. Milk, Cow Negative    6. Casein Negative    7. Egg White, Chicken Negative    8. Shellfish Mix Negative    9. Fish Mix Negative    10. Cashew Negative    11. Walnut Food Negative    12. Almond Negative    13. Hazelnut Negative          Intradermal - 06/23/24 0947     Time Antigen Placed 1000    Allergen Manufacturer Jestine    Location Arm    Number of Test 16    Control Negative    Bahia Negative    French Southern Territories Negative    Johnson Negative    7 Grass Negative    Ragweed Mix Negative    Weed Mix Negative    Tree Mix Negative    Mold 1 3+    Mold 2 3+    Mold 3 3+    Mold 4 3+    Mite Mix Negative    Cat Negative    Dog 1+    Cockroach 3+          Control of Mold Allergen   Mold and fungi can grow on a variety of surfaces provided certain temperature and moisture conditions exist.  Outdoor molds grow on plants, decaying vegetation and soil.  The major outdoor mold, Alternaria and Cladosporium, are found in very high numbers during hot and dry conditions.  Generally, a late Summer - Fall peak is seen for common outdoor fungal spores.  Rain will temporarily lower outdoor mold spore count, but counts rise rapidly when the rainy period ends.  The most important indoor molds  are Aspergillus and Penicillium.  Dark, humid and poorly ventilated  basements are ideal sites for mold growth.  The next most common sites of mold growth are the bathroom and the kitchen.  Outdoor (Seasonal) Mold Control  Positive outdoor molds via skin testing: Alternaria, Cladosporium, Bipolaris (Helminthsporium), Drechslera (Curvalaria), and Mucor  Use air conditioning and keep windows closed Avoid exposure to decaying vegetation. Avoid leaf raking. Avoid grain handling. Consider wearing a face mask if working in moldy areas.    Indoor (Perennial) Mold Control   Positive indoor molds via skin testing: Aspergillus, Penicillium, Fusarium, Aureobasidium (Pullulara), and Rhizopus  Maintain humidity below 50%. Clean washable surfaces with 5% bleach solution. Remove sources e.g. contaminated carpets.    Control of Dog or Cat Allergen  Avoidance is the best way to manage a dog or cat allergy . If you have a dog or cat and are allergic to dog or cats, consider removing the dog or cat from the home. If you have a dog or cat but don't want to find it a new home, or if your family wants a pet even though someone in the household is allergic, here are some strategies that may help keep symptoms at bay:  Keep the pet out of your bedroom and restrict it to only a few rooms. Be advised that keeping the dog or cat in only one room will not limit the allergens to that room. Don't pet, hug or kiss the dog or cat; if you do, wash your hands with soap and water. High-efficiency particulate air (HEPA) cleaners run continuously in a bedroom or living room can reduce allergen levels over time. Regular use of a high-efficiency vacuum cleaner or a central vacuum can reduce allergen levels. Giving your dog or cat a bath at least once a week can reduce airborne allergen.  Control of Cockroach Allergen  Cockroach allergen has been identified as an important cause of acute attacks of asthma, especially in  urban settings.  There are fifty-five species of cockroach that exist in the [place] , however only three, the [place], [place] and [place] species produce allergen that can affect patients with Asthma.  Allergens can be obtained from fecal particles, egg casings and secretions from cockroaches.    Remove food sources. Reduce access to water. Seal access and entry points. Spray runways with 0.5-1% Diazinon or Chlorpyrifos Blow boric acid power under stoves and refrigerator. Place bait stations (hydramethylnon) at feeding sites.

## 2024-06-23 NOTE — Progress Notes (Signed)
 FOLLOW UP  Date of Service/Encounter:  06/23/24   Assessment:   Urticaria   Perennial and seasonal allergic rhinitis (indoor molds, outdoor molds, dog, and cockroach)   Insect sting allergy  (white faced hornet, yellow hornet, honeybee, yellowjacket, paper wasp)  Plan/Recommendations:   1. Urticaria - Keep that appointment with Rheumatology. - Testing to the most common foods was negative.  - In the meantime, continue suppressive dosing of antihistamines:   - Morning: Allegra (fexofenadine) 180mg   - Evening: Allegra (fexofenadine) 180mg  IF NEEDED  - If the above is not working, try adding: Pepcid  (famotidine ) 20mg  twice daily - You can change this dosing at home, decreasing the dose as needed or increasing the dosing as needed.  - If you are not tolerating the medications or are tired of taking them every day, we can start treatment with a monthly injectable medication called Xolair.    2. Chronic rhinitis - Testing today showed: indoor molds, outdoor molds, dog, and cockroach - Copy of test results provided.  - Avoidance measures provided. - Continue with: Allegra (fexofenadine) 180mg  tablet once daily - You can use an extra dose of the antihistamine, if needed, for breakthrough symptoms.  - Consider nasal saline rinses 1-2 times daily to remove allergens from the nasal cavities as well as help with mucous clearance (this is especially helpful to do before the nasal sprays are given)  3. Stinging insect allergy  - EpiPen  is up to date. - Consider venom immunotherapy.   4. Return in about 3 months (around 09/22/2024). You can have the follow up appointment with Dr. Iva or a Nurse Practicioner (our Nurse Practitioners are excellent and always have Physician oversight!).    Subjective:   Anne Watson is a 63 y.o. female presenting today for follow up of No chief complaint on file.   Anne Watson has a history of the following: Patient Active Problem List    Diagnosis Date Noted   Generalized anxiety disorder 04/01/2023   Bipolar disorder, current episode mixed, moderate (HCC) 04/01/2023   Genetic testing 03/23/2020   Family history of breast cancer    Family history of colon cancer    Family history of melanoma    Family history of pancreatic cancer    Family history of multiple myeloma    Depression 01/31/2019   Allergic reaction 01/31/2019   DJD (degenerative joint disease), lumbar 06/11/2014   Thrombocytosis 04/04/2014   KNEE PAIN, LEFT 08/25/2008   PATELLAR TENDINITIS 08/25/2008    History obtained from: chart review and patient.  Discussed the use of AI scribe software for clinical note transcription with the patient and/or guardian, who gave verbal consent to proceed.  Anne Watson is a 63 y.o. female presenting for skin testing. She was last seen on April 9. We could not do testing because her insurance company does not cover testing on the same day as a New Patient visit. She has been off of all antihistamines 3 days in anticipation of the testing.   At that time, she was reporting hives.  We got some labs to look for serious causes of hives and started her on Allegra twice daily and Pepcid  twice daily.  For her rhinitis, we decided to do environmental allergy  testing.  She experiences intense itching whenever she gets these bumps and finds it difficult to refrain from scratching them.   She has a history of elevated ANA levels, which were significantly high at 1:320. She has been referred to a rheumatologist for further evaluation,  but her appointment is not until November. She has not seen a rheumatologist before.  Otherwise, there have been no changes to her past medical history, surgical history, family history, or social history.    Review of systems otherwise negative other than that mentioned in the HPI.    Objective:   There were no vitals taken for this visit. There is no height or weight on file to calculate  BMI.    Physical exam deferred since this was a skin testing appointment only.   Diagnostic studies:   Allergy  Studies:     Airborne Adult Perc - 06/23/24 0908     Time Antigen Placed 0908    Allergen Manufacturer Jestine    Location Back    Number of Test 55    1. Control-Buffer 50% Glycerol Negative    2. Control-Histamine 2+    3. Bahia Negative    4. French Southern Territories Negative    5. Johnson Negative    6. Kentucky  Blue Negative    7. Meadow Fescue Negative    8. Perennial Rye Negative    9. Timothy Negative    10. Ragweed Mix Negative    11. Cocklebur Negative    12. Plantain,  English Negative    13. Baccharis Negative    14. Dog Fennel Negative    15. Russian Thistle Negative    16. Lamb's Quarters Negative    17. Sheep Sorrell Negative    18. Rough Pigweed Negative    19. Marsh Elder, Rough Negative    20. Mugwort, Common Negative    21. Box, Elder Negative    22. Cedar, red Negative    23. Sweet Gum Negative    24. Pecan Pollen Negative    25. Pine Mix Negative    26. Walnut, Black Pollen Negative    27. Red Mulberry Negative    28. Ash Mix Negative    29. Birch Mix Negative    30. Beech American Negative    31. Cottonwood, Guinea-Bissau Negative    32. Hickory, White Negative    33. Maple Mix Negative    34. Oak, Guinea-[place]issau Mix Negative    35. Sycamore Eastern Negative    36. Alternaria Alternata Negative    37. Cladosporium Herbarum Negative    38. Aspergillus Mix Negative    39. Penicillium Mix Negative    40. Bipolaris Sorokiniana (Helminthosporium) Negative    41. Drechslera Spicifera (Curvularia) Negative    42. Mucor Plumbeus Negative    43. Fusarium Moniliforme Negative    44. Aureobasidium Pullulans (pullulara) Negative    45. Rhizopus Oryzae Negative    46. Botrytis Cinera Negative    47. Epicoccum Nigrum Negative    48. Phoma Betae Negative    49. Dust Mite Mix Negative    50. Cat Hair 10,000 BAU/ml Negative    51.  Dog Epithelia Negative    52. Mixed  Feathers Negative    53. Horse Epithelia Negative    54. Cockroach, German Negative    55. Tobacco Leaf Negative          13 Food Perc - 06/23/24 0908       Test Information   Time Antigen Placed 9090    Allergen Manufacturer Jestine    Location Back    Number of allergen test 13      Food   1. Peanut Negative    2. Soybean Negative    3. Wheat Negative    4. Sesame Negative  5. Milk, Cow Negative    6. Casein Negative    7. Egg White, Chicken Negative    8. Shellfish Mix Negative    9. Fish Mix Negative    10. Cashew Negative    11. Walnut Food Negative    12. Almond Negative    13. Hazelnut Negative          Intradermal - 06/23/24 0947     Time Antigen Placed 1000    Allergen Manufacturer Jestine    Location Arm    Number of Test 16    Control Negative    Bahia Negative    French Southern Territories Negative    Johnson Negative    7 Grass Negative    Ragweed Mix Negative    Weed Mix Negative    Tree Mix Negative    Mold 1 3+    Mold 2 3+    Mold 3 3+    Mold 4 3+    Mite Mix Negative    Cat Negative    Dog 1+    Cockroach 3+          Allergy  testing results were read and interpreted by myself, documented by clinical staff.      Marty Shaggy, MD  [facility] 

## 2024-06-23 NOTE — Progress Notes (Signed)
allergy

## 2024-08-01 NOTE — Progress Notes (Unsigned)
 Office Visit Note  Patient: Anne Watson             Date of Birth: 1961/05/09           MRN: [id number]             PCP: Elliot Charm, MD Referring: Iva Marty Saltness, * Visit Date: 08/09/2024 Occupation: ZENO  Subjective:  Pain in multiple joints   History of Present Illness: Anne Watson is a 63 y.o. female who presents today for a new patient consultation.  Patient reports that she has had recurrent urticaria for several years.  Patient states that she has seen dermatology in the past and most recently was evaluated by an allergist-Dr. Iva.  Patient states that she has had a longstanding history of sun sensitivity.  The hives tend to be in sun exposed areas.  Patient states that she has occasional facial rashes as well. Patient states for the past couple of years she has had chronic sicca symptoms.  She denies any salivary stones.  She denies any oral or nasal ulcerations.  She has not had any symptoms of Raynaud's phenomenon. Patient states that she has had chronic pain in the left knee for many years.  For the past couple of years she has had increased discomfort on the lateral aspect of the right hip.  Patient states that the discomfort in her right hip is exacerbated by climbing steps.  Patient is also had chronic pain in both thumbs.  She was previously under the care of Dr. Anderson.  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  Patient states that she had a cyst removed from the right thumb in the past by Dr. Anderson with no recurrence.  She denies any joint swelling. She has chronic fatigue which she attributes to interrupted sleep at night.     Activities of Daily Living:  Patient reports morning stiffness for 0 minutes.   Patient Denies nocturnal pain.  Difficulty dressing/grooming: Denies Difficulty climbing stairs: Denies Difficulty getting out of chair: Reports Difficulty  using hands for taps, buttons, cutlery, and/or writing: Reports  Review of Systems  Constitutional:  Positive for fatigue.  HENT:  Positive for mouth dryness. Negative for mouth sores.   Eyes:  Positive for dryness.  Cardiovascular:  Negative for palpitations.  Gastrointestinal:  Negative for blood in stool, constipation and diarrhea.  Endocrine: Positive for increased urination.  Genitourinary:  Positive for involuntary urination.  Musculoskeletal:  Positive for joint pain, gait problem, joint pain and joint swelling. Negative for myalgias, muscle weakness, morning stiffness, muscle tenderness and myalgias.  Skin:  Positive for rash and sensitivity to sunlight. Negative for color change and hair loss.  Allergic/Immunologic: Negative for susceptible to infections.  Neurological:  Positive for headaches. Negative for dizziness.  Hematological:  Negative for swollen glands.  Psychiatric/Behavioral:  Positive for depressed mood and sleep disturbance. The patient is nervous/anxious.     PMFS History:  Patient Active Problem List   Diagnosis Date Noted   Generalized anxiety disorder 04/01/2023   Bipolar disorder, current episode mixed, moderate (HCC) 04/01/2023   Genetic testing 03/23/2020   Family history of breast cancer    Family history of colon cancer    Family history of melanoma    Family history of pancreatic cancer    Family history of multiple myeloma    Depression 01/31/2019   Allergic reaction 01/31/2019   DJD (degenerative  joint disease), lumbar 06/11/2014   Thrombocytosis 04/04/2014   KNEE PAIN, LEFT 08/25/2008   PATELLAR TENDINITIS 08/25/2008    Past Medical History:  Diagnosis Date   ADHD    Allergic rhinitis    Anxiety    Bipolar disorder (HCC)    Chest pain    Depression    Family history of breast cancer    Family history of colon cancer    Family history of melanoma    Family history of multiple myeloma    Family history of pancreatic cancer    GERD  (gastroesophageal reflux disease)    Hypercholesteremia    Recurrent upper respiratory infection (URI)    Thrombocytosis 04/04/2014   Trigeminal neuralgia     Family History  Problem Relation Age of Onset   Asthma Mother    Breast cancer Mother 74       recurrence at age 36   Asthma Father    Stroke Father    Hypertension Father    Colon cancer Father 58       colon cancer   Breast cancer Sister 42   Pancreatic cancer Paternal Uncle        dx. in his 70s/80s; smoker   Heart Problems Paternal Uncle 14   COPD Paternal Uncle    Multiple myeloma Maternal Grandmother 79   Heart Problems Paternal Grandfather    Asthma Daughter    Other Niece        negative BRCA testing ~20 years ago   Past Surgical History:  Procedure Laterality Date   BREAST BIOPSY Left    CESAREAN SECTION     CHOLECYSTECTOMY     CYST REMOVAL HAND Right    Social History   Tobacco Use   Smoking status: Former    Current packs/day: 0.00    Average packs/day: 2.0 packs/day for 30.0 years (60.0 ttl pk-yrs)    Types: Cigarettes    Start date: 10/06/1974    Quit date: 10/06/2004    Years since quitting: 19.8    Passive exposure: Past   Smokeless tobacco: Never  Vaping Use   Vaping status: Never Used  Substance Use Topics   Alcohol use: Yes    Comment: rarely   Drug use: No   Social History   Social History Narrative   Lives with her two daughters.     Immunization History  Administered Date(s) Administered   Influenza,inj,Quad PF,6+ Mos 08/22/2019   Tdap 01/18/2015     Objective: Vital Signs: BP 129/79 (BP Location: Left Arm, Patient Position: Sitting, Cuff Size: Normal)   Pulse (!) 44   Temp 97.6 F (36.4 C)   Resp 14   Ht 5' 3.5 (1.613 m)   Wt 190 lb 12.8 oz (86.5 kg)   BMI 33.27 kg/m    Physical Exam Vitals and nursing note reviewed.  Constitutional:      Appearance: She is well-developed.  HENT:     Head: Normocephalic and atraumatic.  Eyes:     Conjunctiva/sclera:  Conjunctivae normal.  Cardiovascular:     Rate and Rhythm: Normal rate and regular rhythm.     Heart sounds: Normal heart sounds.  Pulmonary:     Effort: Pulmonary effort is normal.     Breath sounds: Normal breath sounds.  Abdominal:     General: Bowel sounds are normal.     Palpations: Abdomen is soft.  Musculoskeletal:     Cervical back: Normal range of motion.  Lymphadenopathy:     Cervical:  No cervical adenopathy.  Skin:    General: Skin is warm and dry.     Capillary Refill: Capillary refill takes less than 2 seconds.  Neurological:     Mental Status: She is alert and oriented to person, place, and time.  Psychiatric:        Behavior: Behavior normal.      Musculoskeletal Exam: C-spine, thoracic spine, lumbar spine have good range of motion.  No midline spinal tenderness.  No SI joint tenderness.  Shoulder joints, elbow joints, wrist joints, MCPs, PIPs, DIPs have good range of motion with no synovitis.  CMC joint prominence and thickening bilaterally.  Mild PIP and DIP prominence consistent with osteoarthritis of both hands.  Complete fist formation bilaterally.  Hip joints have good range of motion with no groin pain.  Knee joints have good range of motion no warmth or effusion.  Ankle joints have good range of motion no tenderness or joint swelling.    CDAI Exam: CDAI Score: -- Patient Global: --; Provider Global: -- Swollen: --; Tender: -- Joint Exam 08/09/2024   No joint exam has been documented for this visit   There is currently no information documented on the homunculus. Go to the Rheumatology activity and complete the homunculus joint exam.  Investigation: No additional findings.  Imaging: No results found.  Recent Labs: Lab Results  Component Value Date   WBC 5.2 04/04/2014   HGB 11.5 (L) 04/04/2014   PLT 411 (H) 04/04/2014   NA 138 06/14/2024   K 5.1 06/14/2024   CL 101 06/14/2024   CO2 23 06/14/2024   GLUCOSE 88 06/14/2024   BUN 13 06/14/2024    CREATININE 0.76 06/14/2024   BILITOT 0.5 06/14/2024   ALKPHOS 70 06/14/2024   AST 41 (H) 06/14/2024   ALT 32 06/14/2024   PROT 7.0 06/14/2024   ALBUMIN 4.2 06/14/2024   CALCIUM  10.4 (H) 06/14/2024    Speciality Comments: No specialty comments available.  Procedures:  No procedures performed Allergies: Almond oil, Bee venom, Peanut-containing drug products, Ceftin [cefuroxime axetil], Dog epithelium, Molds & smuts, Penicillins, Tetracyclines & related, Codeine, and Penicillin g   Assessment / Plan:     Visit Diagnoses: Positive ANA (antinuclear antibody) - 06/14/24: ANA 1:160 speckled, 1:320H, CRP 2, ESR 18, TPO-, thyroglobulin-: Longstanding history of recurrent urticaria, arthralgias, joint stiffness, and sicca symptoms: Patient presents today with increased pain and stiffness involving multiple joints.  She has no synovitis on examination today.  She has CMC joint prominence and thickening bilaterally as well as DIP thickening consistent with osteoarthritic changes involving both hands.  X-rays of both hands were obtained today for further evaluation. Plan to update the following lab work today for further evaluation.   - Plan: C3 and C4, ANA, Anti-scleroderma antibody, RNP Antibody, Anti-Smith antibody, Sjogrens syndrome-A extractable nuclear antibody, Sjogrens syndrome-B extractable nuclear antibody, Anti-DNA antibody, double-stranded, Urinalysis, Routine w reflex microscopic, CBC with Differential/Platelet, Comprehensive metabolic panel with GFR, Rheumatoid factor, Cyclic citrul peptide antibody, IgG, Mutated Citrullinated Vimentin (MCV) Antibody  Hives - Under care of allergist-Dr. Iva: Recurrent urticaria--patient has been evaluated by dermatology and allergist. ANA positive on 06/14/2024.  The following lab work will be obtained today for further evaluation.  Pain in both hands -She has discomfort and stiffness in both hands due to underlying osteoarthritis.  CMC joint prominence as  well as PIP prominence consistent with osteoarthritic change noted.  No synovitis was apparent.  X-rays of both hands updated today.  Plan to obtain the following  lab work today.  Plan: Rheumatoid factor, Cyclic citrul peptide antibody, IgG, Mutated Citrullinated Vimentin (MCV) Antibody, XR Hand 2 View Left, XR Hand 2 View Right  Chronic pain of left knee -Intermittent discomfort.  No warmth or effusion noted. X-rays of the left knee were updated today.   Plan: XR KNEE 3 VIEW LEFT  Thrombocytosis: CBC updated today.   Osteoarthritis of spine with radiculopathy, lumbar region - No injections or surgery. Manageable. No symptoms of radiculopathy.   Other medical conditions are listed as follows:   Generalized anxiety disorder  Bipolar disorder, current episode mixed, moderate (HCC)  Family history of breast cancer  Family history of melanoma  Family history of colon cancer  Family history of multiple myeloma  Family history of pancreatic cancer   Orders: Orders Placed This Encounter  Procedures   XR KNEE 3 VIEW LEFT   XR Hand 2 View Left   XR Hand 2 View Right   C3 and C4   ANA   Anti-scleroderma antibody   RNP Antibody   Anti-Smith antibody   Sjogrens syndrome-A extractable nuclear antibody   Sjogrens syndrome-B extractable nuclear antibody   Anti-DNA antibody, double-stranded   Urinalysis, Routine w reflex microscopic   CBC with Differential/Platelet   Comprehensive metabolic panel with GFR   Rheumatoid factor   Cyclic citrul peptide antibody, IgG   Mutated Citrullinated Vimentin (MCV) Antibody   No orders of the defined types were placed in this encounter.   Follow-Up Instructions: Return for NPFU.   Anne CHRISTELLA Craze, PA-C  Note - This record has been created using Dragon software.  Chart creation errors have been sought, but may not always  have been located. Such creation errors do not reflect on  the standard of medical care.

## 2024-08-02 ENCOUNTER — Other Ambulatory Visit: Payer: Self-pay | Admitting: Medical Genetics

## 2024-08-09 ENCOUNTER — Ambulatory Visit

## 2024-08-09 ENCOUNTER — Ambulatory Visit: Attending: Physician Assistant | Admitting: Physician Assistant

## 2024-08-09 ENCOUNTER — Encounter: Payer: Self-pay | Admitting: Physician Assistant

## 2024-08-09 VITALS — BP 129/79 | HR 44 | Temp 97.6°F | Resp 14 | Ht 63.5 in | Wt 190.8 lb

## 2024-08-09 DIAGNOSIS — M25562 Pain in left knee: Secondary | ICD-10-CM

## 2024-08-09 DIAGNOSIS — D75839 Thrombocytosis, unspecified: Secondary | ICD-10-CM | POA: Diagnosis not present

## 2024-08-09 DIAGNOSIS — F411 Generalized anxiety disorder: Secondary | ICD-10-CM

## 2024-08-09 DIAGNOSIS — M4726 Other spondylosis with radiculopathy, lumbar region: Secondary | ICD-10-CM

## 2024-08-09 DIAGNOSIS — L509 Urticaria, unspecified: Secondary | ICD-10-CM | POA: Diagnosis not present

## 2024-08-09 DIAGNOSIS — M79642 Pain in left hand: Secondary | ICD-10-CM | POA: Diagnosis not present

## 2024-08-09 DIAGNOSIS — G8929 Other chronic pain: Secondary | ICD-10-CM

## 2024-08-09 DIAGNOSIS — Z800 Family history of malignant neoplasm of digestive organs: Secondary | ICD-10-CM

## 2024-08-09 DIAGNOSIS — M79641 Pain in right hand: Secondary | ICD-10-CM | POA: Diagnosis not present

## 2024-08-09 DIAGNOSIS — F3162 Bipolar disorder, current episode mixed, moderate: Secondary | ICD-10-CM

## 2024-08-09 DIAGNOSIS — Z807 Family history of other malignant neoplasms of lymphoid, hematopoietic and related tissues: Secondary | ICD-10-CM

## 2024-08-09 DIAGNOSIS — R7689 Other specified abnormal immunological findings in serum: Secondary | ICD-10-CM | POA: Diagnosis not present

## 2024-08-09 DIAGNOSIS — Z808 Family history of malignant neoplasm of other organs or systems: Secondary | ICD-10-CM

## 2024-08-09 DIAGNOSIS — Z803 Family history of malignant neoplasm of breast: Secondary | ICD-10-CM

## 2024-08-11 ENCOUNTER — Ambulatory Visit: Payer: Self-pay | Admitting: Physician Assistant

## 2024-08-11 NOTE — Progress Notes (Signed)
 Plan to discuss results at NPFU

## 2024-08-13 LAB — ANTI-DNA ANTIBODY, DOUBLE-STRANDED: ds DNA Ab: 2 [IU]/mL

## 2024-08-13 LAB — URINALYSIS, ROUTINE W REFLEX MICROSCOPIC
Bacteria, UA: NONE SEEN /HPF
Bilirubin Urine: NEGATIVE
Glucose, UA: NEGATIVE
Hgb urine dipstick: NEGATIVE
Hyaline Cast: NONE SEEN /LPF
Ketones, ur: NEGATIVE
Leukocytes,Ua: NEGATIVE
Nitrite: NEGATIVE
RBC / HPF: NONE SEEN /HPF (ref 0–2)
Specific Gravity, Urine: 1.029 (ref 1.001–1.035)
pH: 5.5 (ref 5.0–8.0)

## 2024-08-13 LAB — COMPREHENSIVE METABOLIC PANEL WITH GFR
AG Ratio: 1.6 (calc) (ref 1.0–2.5)
ALT: 46 U/L — ABNORMAL HIGH (ref 6–29)
AST: 58 U/L — ABNORMAL HIGH (ref 10–35)
Albumin: 4.4 g/dL (ref 3.6–5.1)
Alkaline phosphatase (APISO): 87 U/L (ref 37–153)
BUN: 16 mg/dL (ref 7–25)
CO2: 31 mmol/L (ref 20–32)
Calcium: 9.7 mg/dL (ref 8.6–10.4)
Chloride: 100 mmol/L (ref 98–110)
Creat: 0.8 mg/dL (ref 0.50–1.05)
Globulin: 2.8 g/dL (ref 1.9–3.7)
Glucose, Bld: 87 mg/dL (ref 65–99)
Potassium: 4.7 mmol/L (ref 3.5–5.3)
Sodium: 139 mmol/L (ref 135–146)
Total Bilirubin: 0.7 mg/dL (ref 0.2–1.2)
Total Protein: 7.2 g/dL (ref 6.1–8.1)
eGFR: 82 mL/min/1.73m2 (ref >=60)

## 2024-08-13 LAB — ANTI-NUCLEAR AB-TITER (ANA TITER): ANA Titer 1: 1:160 {titer} — ABNORMAL HIGH

## 2024-08-13 LAB — CBC WITH DIFFERENTIAL/PLATELET
Absolute Lymphocytes: 2175 {cells}/uL (ref 850–3900)
Absolute Monocytes: 599 {cells}/uL (ref 200–950)
Basophils Absolute: 73 {cells}/uL (ref 0–200)
Basophils Relative: 1 %
Eosinophils Absolute: 110 {cells}/uL (ref 15–500)
Eosinophils Relative: 1.5 %
HCT: 42 % (ref 35.0–45.0)
Hemoglobin: 13.5 g/dL (ref 11.7–15.5)
MCH: 28.8 pg (ref 27.0–33.0)
MCHC: 32.1 g/dL (ref 32.0–36.0)
MCV: 89.6 fL (ref 80.0–100.0)
MPV: 10.9 fL (ref 7.5–12.5)
Monocytes Relative: 8.2 %
Neutro Abs: 4344 {cells}/uL (ref 1500–7800)
Neutrophils Relative %: 59.5 %
Platelets: 492 10*3/uL — ABNORMAL HIGH (ref 140–400)
RBC: 4.69 Million/uL (ref 3.80–5.10)
RDW: 13 % (ref 11.0–15.0)
Total Lymphocyte: 29.8 %
WBC: 7.3 10*3/uL (ref 3.8–10.8)

## 2024-08-13 LAB — ANA: Anti Nuclear Antibody (ANA): POSITIVE — AB

## 2024-08-13 LAB — RHEUMATOID FACTOR: Rheumatoid fact SerPl-aCnc: <10 [IU]/mL (ref <14)

## 2024-08-13 LAB — C3 AND C4
C3 Complement: 191 mg/dL (ref 83–193)
C4 Complement: 29 mg/dL (ref 15–57)

## 2024-08-13 LAB — SJOGRENS SYNDROME-A EXTRACTABLE NUCLEAR ANTIBODY: SSA (Ro) (ENA) Antibody, IgG: <1 AI

## 2024-08-13 LAB — RNP ANTIBODY: Ribonucleic Protein(ENA) Antibody, IgG: <1 AI

## 2024-08-13 LAB — MICROSCOPIC MESSAGE

## 2024-08-13 LAB — ANTI-SCLERODERMA ANTIBODY: Scleroderma (Scl-70) (ENA) Antibody, IgG: <1 AI

## 2024-08-13 LAB — SJOGRENS SYNDROME-B EXTRACTABLE NUCLEAR ANTIBODY: SSB (La) (ENA) Antibody, IgG: <1 AI

## 2024-08-13 LAB — ANTI-SMITH ANTIBODY: ENA SM Ab Ser-aCnc: <1 AI

## 2024-08-13 LAB — CYCLIC CITRUL PEPTIDE ANTIBODY, IGG: Cyclic Citrullin Peptide Ab: <16 U

## 2024-08-13 LAB — MUTATED CITRULLINATED VIMENTIN (MCV) ANTIBODY: MUTATED CITRULLINATED VIMENTIN (MCV) AB: <20 U/mL (ref <20)

## 2024-09-07 ENCOUNTER — Other Ambulatory Visit

## 2024-09-12 NOTE — Progress Notes (Unsigned)
 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   History of Present Illness: Anne Watson is a 63 y.o. female ***     Activities of Daily Living:  Patient reports morning stiffness for *** {minute/hour:19697}.   Patient {ACTIONS;DENIES/REPORTS:21021675::Denies} nocturnal pain.  Difficulty dressing/grooming: {ACTIONS;DENIES/REPORTS:21021675::Denies} Difficulty climbing stairs: {ACTIONS;DENIES/REPORTS:21021675::Denies} Difficulty getting out of chair: {ACTIONS;DENIES/REPORTS:21021675::Denies} Difficulty using hands for taps, buttons, cutlery, and/or writing: {ACTIONS;DENIES/REPORTS:21021675::Denies}  No Rheumatology ROS completed.   PMFS History:  Patient Active Problem List   Diagnosis Date Noted   Generalized anxiety disorder 04/01/2023   Bipolar disorder, current episode mixed, moderate (HCC) 04/01/2023   Genetic testing 03/23/2020   Family history of breast cancer    Family history of colon cancer    Family history of melanoma    Family history of pancreatic cancer    Family history of multiple myeloma    Depression 01/31/2019   Allergic reaction 01/31/2019   DJD (degenerative joint disease), lumbar 06/11/2014   Thrombocytosis 04/04/2014   KNEE PAIN, LEFT 08/25/2008   PATELLAR TENDINITIS 08/25/2008    Past Medical History:  Diagnosis Date   ADHD    Allergic rhinitis    Anxiety    Bipolar disorder (HCC)    Chest pain    Depression    Family history of breast cancer    Family history of colon cancer    Family history of melanoma    Family history of multiple myeloma    Family history of pancreatic cancer    GERD (gastroesophageal reflux disease)    Hypercholesteremia    Recurrent upper respiratory infection (URI)     Thrombocytosis 04/04/2014   Trigeminal neuralgia     Family History  Problem Relation Age of Onset   Asthma Mother    Breast cancer Mother 71       recurrence at age 69   Asthma Father    Stroke Father    Hypertension Father    Colon cancer Father 21       colon cancer   Breast cancer Sister 53   Pancreatic cancer Paternal Uncle        dx. in his 70s/80s; smoker   Heart Problems Paternal Uncle 51   COPD Paternal Uncle    Multiple myeloma Maternal Grandmother 74   Heart Problems Paternal Grandfather    Asthma Daughter    Other Niece        negative BRCA testing ~20 years ago   Past Surgical History:  Procedure Laterality Date   BREAST BIOPSY Left    CESAREAN SECTION     CHOLECYSTECTOMY     CYST REMOVAL HAND Right    Social History   Tobacco Use   Smoking status: Former    Current packs/day: 0.00    Average packs/day: 2.0 packs/day for 30.0 years (60.0 ttl pk-yrs)    Types: Cigarettes    Start date: 10/06/1974    Quit date: 10/06/2004    Years since quitting: 19.9    Passive exposure: Past   Smokeless tobacco: Never  Vaping Use   Vaping status: Never Used  Substance Use Topics   Alcohol use: Yes  Comment: rarely   Drug use: No   Social History   Social History Narrative   Lives with her two daughters.     Immunization History  Administered Date(s) Administered   Influenza,inj,Quad PF,6+ Mos 08/22/2019   Tdap 01/18/2015     Objective: Vital Signs: There were no vitals taken for this visit.   Physical Exam   Musculoskeletal Exam: ***  CDAI Exam: CDAI Score: -- Patient Global: --; Provider Global: -- Swollen: --; Tender: -- Joint Exam 09/15/2024   No joint exam has been documented for this visit   There is currently no information documented on the homunculus. Go to the Rheumatology activity and complete the homunculus joint exam.  Investigation: No additional findings.  Imaging: No results found.  Recent Labs: Lab Results  Component  Value Date   WBC 7.3 08/09/2024   HGB 13.5 08/09/2024   PLT 492 (H) 08/09/2024   NA 139 08/09/2024   K 4.7 08/09/2024   CL 100 08/09/2024   CO2 31 08/09/2024   GLUCOSE 87 08/09/2024   BUN 16 08/09/2024   CREATININE 0.80 08/09/2024   BILITOT 0.7 08/09/2024   ALKPHOS 70 06/14/2024   AST 58 (H) 08/09/2024   ALT 46 (H) 08/09/2024   PROT 7.2 08/09/2024   ALBUMIN 4.2 06/14/2024   CALCIUM  9.7 08/09/2024    Speciality Comments: No specialty comments available.  Procedures:  No procedures performed Allergies: Almond oil, Bee venom, Peanut-containing drug products, Ceftin [cefuroxime axetil], Dog epithelium, Molds & smuts, Penicillins, Tetracyclines & related, Codeine, and Penicillin g   Assessment / Plan:     Visit Diagnoses: Positive ANA (antinuclear antibody)  Thrombocytosis  Hives  Osteoarthritis of spine with radiculopathy, lumbar region  Generalized anxiety disorder  Bipolar disorder, current episode mixed, moderate (HCC)  Family history of breast cancer  Family history of colon cancer  Family history of melanoma  Family history of multiple myeloma  Family history of pancreatic cancer  Orders: No orders of the defined types were placed in this encounter.  No orders of the defined types were placed in this encounter.   Face-to-face time spent with patient was *** minutes. Greater than 50% of time was spent in counseling and coordination of care.  Follow-Up Instructions: No follow-ups on file.   Waddell CHRISTELLA Craze, PA-C  Note - This record has been created using Dragon software.  Chart creation errors have been sought, but may not always  have been located. Such creation errors do not reflect on  the standard of medical care.

## 2024-09-15 ENCOUNTER — Ambulatory Visit: Attending: Physician Assistant | Admitting: Physician Assistant

## 2024-09-15 ENCOUNTER — Encounter: Payer: Self-pay | Admitting: Physician Assistant

## 2024-09-15 VITALS — BP 131/83 | HR 75 | Temp 97.6°F | Resp 14 | Ht 63.0 in | Wt 193.6 lb

## 2024-09-15 DIAGNOSIS — R7989 Other specified abnormal findings of blood chemistry: Secondary | ICD-10-CM

## 2024-09-15 DIAGNOSIS — R809 Proteinuria, unspecified: Secondary | ICD-10-CM | POA: Diagnosis not present

## 2024-09-15 DIAGNOSIS — Z808 Family history of malignant neoplasm of other organs or systems: Secondary | ICD-10-CM

## 2024-09-15 DIAGNOSIS — M2242 Chondromalacia patellae, left knee: Secondary | ICD-10-CM

## 2024-09-15 DIAGNOSIS — M4726 Other spondylosis with radiculopathy, lumbar region: Secondary | ICD-10-CM

## 2024-09-15 DIAGNOSIS — Z800 Family history of malignant neoplasm of digestive organs: Secondary | ICD-10-CM | POA: Diagnosis not present

## 2024-09-15 DIAGNOSIS — M19041 Primary osteoarthritis, right hand: Secondary | ICD-10-CM

## 2024-09-15 DIAGNOSIS — Z807 Family history of other malignant neoplasms of lymphoid, hematopoietic and related tissues: Secondary | ICD-10-CM

## 2024-09-15 DIAGNOSIS — L509 Urticaria, unspecified: Secondary | ICD-10-CM

## 2024-09-15 DIAGNOSIS — M25512 Pain in left shoulder: Secondary | ICD-10-CM

## 2024-09-15 DIAGNOSIS — R7689 Other specified abnormal immunological findings in serum: Secondary | ICD-10-CM | POA: Diagnosis not present

## 2024-09-15 DIAGNOSIS — F3162 Bipolar disorder, current episode mixed, moderate: Secondary | ICD-10-CM

## 2024-09-15 DIAGNOSIS — M19042 Primary osteoarthritis, left hand: Secondary | ICD-10-CM

## 2024-09-15 DIAGNOSIS — Z803 Family history of malignant neoplasm of breast: Secondary | ICD-10-CM

## 2024-09-15 DIAGNOSIS — F411 Generalized anxiety disorder: Secondary | ICD-10-CM

## 2024-09-15 DIAGNOSIS — G8929 Other chronic pain: Secondary | ICD-10-CM

## 2024-09-15 DIAGNOSIS — D75839 Thrombocytosis, unspecified: Secondary | ICD-10-CM

## 2024-09-15 DIAGNOSIS — M7752 Other enthesopathy of left foot: Secondary | ICD-10-CM

## 2024-09-15 NOTE — Patient Instructions (Signed)

## 2024-09-16 ENCOUNTER — Ambulatory Visit: Payer: Self-pay | Admitting: Physician Assistant

## 2024-09-16 LAB — PROTEIN / CREATININE RATIO, URINE
Creatinine, Urine: 73 mg/dL (ref 20–275)
Total Protein, Urine: <4 mg/dL — ABNORMAL LOW (ref 5–24)

## 2024-09-16 LAB — HEPATIC FUNCTION PANEL
AG Ratio: 1.4 (calc) (ref 1.0–2.5)
ALT: 33 U/L — ABNORMAL HIGH (ref 6–29)
AST: 36 U/L — ABNORMAL HIGH (ref 10–35)
Albumin: 3.9 g/dL (ref 3.6–5.1)
Alkaline phosphatase (APISO): 67 U/L (ref 37–153)
Bilirubin, Direct: 0.1 mg/dL (ref 0.0–0.2)
Globulin: 2.8 g/dL (ref 1.9–3.7)
Indirect Bilirubin: 0.6 mg/dL (ref 0.2–1.2)
Total Bilirubin: 0.7 mg/dL (ref 0.2–1.2)
Total Protein: 6.7 g/dL (ref 6.1–8.1)

## 2024-09-16 NOTE — Progress Notes (Signed)
 AST and ALT remain borderline elevated but have improved-great news! Rest of hepatic function panel WNL. No proteinuria-great news!

## 2024-09-22 ENCOUNTER — Ambulatory Visit: Admitting: Allergy & Immunology

## 2024-12-15 ENCOUNTER — Ambulatory Visit: Admitting: Physician Assistant
